# Patient Record
Sex: Female | Born: 1980
Health system: Southern US, Community
[De-identification: ages and names within clinical notes are randomized; demographics above are authoritative.]

## PROBLEM LIST (undated history)

## (undated) DIAGNOSIS — N920 Excessive and frequent menstruation with regular cycle: Secondary | ICD-10-CM

## (undated) DIAGNOSIS — L661 Lichen planopilaris: Secondary | ICD-10-CM

## (undated) DIAGNOSIS — N9489 Other specified conditions associated with female genital organs and menstrual cycle: Secondary | ICD-10-CM

## (undated) DIAGNOSIS — Z8489 Family history of other specified conditions: Secondary | ICD-10-CM

## (undated) DIAGNOSIS — G4733 Obstructive sleep apnea (adult) (pediatric): Principal | ICD-10-CM

## (undated) DIAGNOSIS — L6612 Frontal fibrosing alopecia: Secondary | ICD-10-CM

## (undated) HISTORY — PX: BUNIONECTOMY: SHX129

## (undated) HISTORY — DX: Obstructive sleep apnea (adult) (pediatric): G47.33

---

## 2003-12-10 HISTORY — PX: ORIF ANKLE FRACTURE: SUR919

## 2007-12-10 HISTORY — PX: WISDOM TOOTH EXTRACTION: SHX21

## 2009-12-09 HISTORY — PX: POLYPECTOMY: SHX149

## 2013-07-09 ENCOUNTER — Encounter: Payer: Self-pay | Admitting: Family Medicine

## 2013-07-09 ENCOUNTER — Ambulatory Visit (INDEPENDENT_AMBULATORY_CARE_PROVIDER_SITE_OTHER): Payer: BC Managed Care – PPO | Admitting: Family Medicine

## 2013-07-09 VITALS — BP 109/72 | HR 73 | Ht 64.0 in | Wt 161.0 lb

## 2013-07-09 DIAGNOSIS — R5381 Other malaise: Secondary | ICD-10-CM

## 2013-07-09 DIAGNOSIS — Z3169 Encounter for other general counseling and advice on procreation: Secondary | ICD-10-CM

## 2013-07-09 DIAGNOSIS — E663 Overweight: Secondary | ICD-10-CM

## 2013-07-09 DIAGNOSIS — L309 Dermatitis, unspecified: Secondary | ICD-10-CM

## 2013-07-09 DIAGNOSIS — R5383 Other fatigue: Secondary | ICD-10-CM

## 2013-07-09 DIAGNOSIS — L259 Unspecified contact dermatitis, unspecified cause: Secondary | ICD-10-CM

## 2013-07-09 DIAGNOSIS — Z13220 Encounter for screening for lipoid disorders: Secondary | ICD-10-CM

## 2013-07-09 DIAGNOSIS — E559 Vitamin D deficiency, unspecified: Secondary | ICD-10-CM

## 2013-07-09 DIAGNOSIS — D649 Anemia, unspecified: Secondary | ICD-10-CM

## 2013-07-09 LAB — COMPLETE METABOLIC PANEL WITH GFR
ALT: 14 U/L (ref 0–35)
AST: 21 U/L (ref 0–37)
Albumin: 4.2 g/dL (ref 3.5–5.2)
Alkaline Phosphatase: 63 U/L (ref 39–117)
BUN: 11 mg/dL (ref 6–23)
CO2: 28 mEq/L (ref 19–32)
Calcium: 9.8 mg/dL (ref 8.4–10.5)
Chloride: 102 mEq/L (ref 96–112)
Creat: 1.04 mg/dL (ref 0.50–1.10)
GFR, Est African American: 83 mL/min
GFR, Est Non African American: 72 mL/min
Glucose, Bld: 85 mg/dL (ref 70–99)
Potassium: 4.2 mEq/L (ref 3.5–5.3)
Sodium: 136 mEq/L (ref 135–145)
Total Bilirubin: 0.3 mg/dL (ref 0.3–1.2)
Total Protein: 7.4 g/dL (ref 6.0–8.3)

## 2013-07-09 LAB — CBC WITH DIFFERENTIAL/PLATELET
Basophils Absolute: 0 10*3/uL (ref 0.0–0.1)
Basophils Relative: 1 % (ref 0–1)
Eosinophils Absolute: 0.1 10*3/uL (ref 0.0–0.7)
Eosinophils Relative: 2 % (ref 0–5)
HCT: 36.2 % (ref 36.0–46.0)
Hemoglobin: 12.1 g/dL (ref 12.0–15.0)
Lymphocytes Relative: 36 % (ref 12–46)
Lymphs Abs: 1.9 10*3/uL (ref 0.7–4.0)
MCH: 28.6 pg (ref 26.0–34.0)
MCHC: 33.4 g/dL (ref 30.0–36.0)
MCV: 85.6 fL (ref 78.0–100.0)
Monocytes Absolute: 0.6 10*3/uL (ref 0.1–1.0)
Monocytes Relative: 11 % (ref 3–12)
Neutro Abs: 2.8 10*3/uL (ref 1.7–7.7)
Neutrophils Relative %: 50 % (ref 43–77)
Platelets: 334 10*3/uL (ref 150–400)
RBC: 4.23 MIL/uL (ref 3.87–5.11)
RDW: 14.8 % (ref 11.5–15.5)
WBC: 5.4 10*3/uL (ref 4.0–10.5)

## 2013-07-09 LAB — CHOLESTEROL, TOTAL: Cholesterol: 152 mg/dL (ref 0–200)

## 2013-07-09 MED ORDER — CLOBETASOL PROPIONATE 0.05 % EX CREA: TOPICAL_CREAM | Freq: Two times a day (BID) | CUTANEOUS | Status: DC

## 2013-07-09 MED ORDER — FOLIC ACID 1 MG PO TABS: 1.0000 mg | ORAL_TABLET | Freq: Every day | ORAL | Status: DC

## 2013-07-09 NOTE — Progress Notes (Signed)
  Subjective:    Patient ID: Stephanie Marshall, female    DOB: [birth year], 32 y.o.   MRN: [id number]  HPI  Stephanie Marshall is here today to establish care with our practice.  She was referred to [place] by her husband  Stephanie Marshall).  They recently moved from [place] so she needs to become established with a PCP.  She has a couple of issues to discuss today.      1)  Weight:  She is concerned about her weight.  She has been watching her diet and exercises but continues to gain which is very frustrating for her.   2)  Eczema:  She has struggled with this problem in the past.  She has been given steroid creams which have helped her but she has ran out and does not remember the name.    3)  Heavy Periods:  She has very heavy periods and has been anemic in the past.    4)  Fatigue:  She has been fatigued lately which may be due to her being anemic.    Review of Systems  Constitutional: Positive for fatigue and unexpected weight change.  HENT: Negative.   Eyes: Negative.   Respiratory: Negative.   Cardiovascular: Negative.   Gastrointestinal: Negative.   Endocrine: Negative.   Genitourinary: Negative.   Skin: Positive for rash.  Neurological: Negative.   Psychiatric/Behavioral: Negative.      Past Medical History  Diagnosis Date  . Eczema   . Vitamin D deficiency   . Anemia   . Dysmenorrhea   . Allergy     Family History  Problem Relation Age of Onset  . Hypertension Mother   . Cancer Mother   . Hypertension Father   . Hyperlipidemia Father   . Alcohol abuse Maternal Grandfather   . 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 No distress.  HENT:  Head: Normocephalic.  Eyes: No scleral icterus.  Neck: No thyromegaly present.  Cardiovascular: Normal rate, regular rhythm and normal heart sounds.   Pulmonary/Chest: Effort normal and breath sounds normal.  Abdominal: There is no tenderness.  Musculoskeletal: She exhibits no edema and no tenderness.  Neurological: She is alert.  Skin: Skin is warm and dry.  Psychiatric: She has a normal mood and affect. Her behavior is normal. Judgment and thought content normal.          Assessment & Plan:

## 2013-07-09 NOTE — Patient Instructions (Addendum)
1)  Eczema - Try the Steroid Cream; Clorox Bath 1/4 cup in tub, Tar Shampoo (15 mins)  2)  Preconception - Folic Acid   3)  Anemia/Heavy Bleeding - ? Iron therapy and Lysteda (decreases bleeding)    Exercise to Lose Weight Exercise and a healthy diet may help you lose weight. Your doctor may suggest specific exercises. EXERCISE IDEAS AND TIPS  Choose low-cost things you enjoy doing, such as walking, bicycling, or exercising to workout videos.  Take stairs instead of the elevator.  Walk during your lunch break.  Park your car further away from work or school.  Go to a gym or an exercise class.  Start with 5 to 10 minutes of exercise each day. Build up to 30 minutes of exercise 4 to 6 days a week.  Wear shoes with good support and comfortable clothes.  Stretch before and after working out.  Work out until you breathe harder and your heart beats faster.  Drink extra water when you exercise.  Do not do so much that you hurt yourself, feel dizzy, or get very short of breath. Exercises that burn about 150 calories:  Running 1  miles in 15 minutes.  Playing volleyball for 45 to 60 minutes.  Washing and waxing a car for 45 to 60 minutes.  Playing touch football for 45 minutes.  Walking 1  miles in 35 minutes.  Pushing a stroller 1  miles in 30 minutes.  Playing basketball for 30 minutes.  Raking leaves for 30 minutes.  Bicycling 5 miles in 30 minutes.  Walking 2 miles in 30 minutes.  Dancing for 30 minutes.  Shoveling snow for 15 minutes.  Swimming laps for 20 minutes.  Walking up stairs for 15 minutes.  Bicycling 4 miles in 15 minutes.  Gardening for 30 to 45 minutes.  Jumping rope for 15 minutes.  Washing windows or floors for 45 to 60 minutes. Document Released: 12/28/2010 Document Revised: 02/17/2012 Document Reviewed: 12/28/2010 [facility] Patient Information 2014 [place], [place].

## 2013-07-10 LAB — VITAMIN D 25 HYDROXY (VIT D DEFICIENCY, FRACTURES): Vit D, 25-Hydroxy: 29 ng/mL — ABNORMAL LOW (ref 30–89)

## 2013-07-10 LAB — TSH: TSH: 1.074 u[IU]/mL (ref 0.350–4.500)

## 2013-07-20 ENCOUNTER — Encounter: Payer: Self-pay | Admitting: Family Medicine

## 2013-07-20 ENCOUNTER — Ambulatory Visit (INDEPENDENT_AMBULATORY_CARE_PROVIDER_SITE_OTHER): Payer: BC Managed Care – PPO | Admitting: Family Medicine

## 2013-07-20 VITALS — BP 106/73 | HR 79 | Resp 16 | Wt 158.0 lb

## 2013-07-20 DIAGNOSIS — L309 Dermatitis, unspecified: Secondary | ICD-10-CM | POA: Insufficient documentation

## 2013-07-20 DIAGNOSIS — K649 Unspecified hemorrhoids: Secondary | ICD-10-CM

## 2013-07-20 DIAGNOSIS — B372 Candidiasis of skin and nail: Secondary | ICD-10-CM

## 2013-07-20 DIAGNOSIS — L814 Other melanin hyperpigmentation: Secondary | ICD-10-CM

## 2013-07-20 DIAGNOSIS — R5383 Other fatigue: Secondary | ICD-10-CM | POA: Insufficient documentation

## 2013-07-20 DIAGNOSIS — Z3169 Encounter for other general counseling and advice on procreation: Secondary | ICD-10-CM | POA: Insufficient documentation

## 2013-07-20 DIAGNOSIS — L259 Unspecified contact dermatitis, unspecified cause: Secondary | ICD-10-CM

## 2013-07-20 DIAGNOSIS — L819 Disorder of pigmentation, unspecified: Secondary | ICD-10-CM

## 2013-07-20 DIAGNOSIS — E559 Vitamin D deficiency, unspecified: Secondary | ICD-10-CM | POA: Insufficient documentation

## 2013-07-20 DIAGNOSIS — D649 Anemia, unspecified: Secondary | ICD-10-CM | POA: Insufficient documentation

## 2013-07-20 DIAGNOSIS — E663 Overweight: Secondary | ICD-10-CM | POA: Insufficient documentation

## 2013-07-20 DIAGNOSIS — IMO0001 Reserved for inherently not codable concepts without codable children: Secondary | ICD-10-CM

## 2013-07-20 DIAGNOSIS — Z13220 Encounter for screening for lipoid disorders: Secondary | ICD-10-CM | POA: Insufficient documentation

## 2013-07-20 DIAGNOSIS — R61 Generalized hyperhidrosis: Secondary | ICD-10-CM

## 2013-07-20 DIAGNOSIS — R5381 Other malaise: Secondary | ICD-10-CM | POA: Insufficient documentation

## 2013-07-20 MED ORDER — HYDROQUINONE 4 % EX CREA: TOPICAL_CREAM | Freq: Two times a day (BID) | CUTANEOUS | Status: DC

## 2013-07-20 MED ORDER — NYSTATIN 100000 UNIT/GM EX POWD: Freq: Two times a day (BID) | CUTANEOUS | Status: DC

## 2013-07-20 MED ORDER — TRIAMCINOLONE ACETONIDE 0.1 % EX CREA: TOPICAL_CREAM | Freq: Two times a day (BID) | CUTANEOUS | Status: DC

## 2013-07-20 MED ORDER — ALUMINUM CHLORIDE 20 % EX SOLN: CUTANEOUS | Status: DC

## 2013-07-20 NOTE — Assessment & Plan Note (Addendum)
We discussed several things she can do for her eczema and she was given a prescription for clobetasol cream.

## 2013-07-20 NOTE — Assessment & Plan Note (Addendum)
She has very heavy periods which contributes to her anemia.  We discussed various options for this problem.  We'll check a CBC to see where her hemoglobin is at this point.

## 2013-07-20 NOTE — Assessment & Plan Note (Signed)
Checking a TSH.   

## 2013-07-20 NOTE — Assessment & Plan Note (Signed)
Stephanie Marshall and Stephanie Marshall are not "actively" trying to conceive but they are not doing anything to avoid it either so...she should be on at least 1 mg of folic acid to prevent neural tube defects.

## 2013-07-20 NOTE — Assessment & Plan Note (Signed)
Checking a lipid panel.  

## 2013-07-20 NOTE — Assessment & Plan Note (Signed)
We discussed the fact that she has entered into a new decade and it will be harder to keep the weight off.  She will need to lower her calories and increase her exercise.

## 2013-07-20 NOTE — Progress Notes (Signed)
  Subjective:    Patient ID: Stephanie Marshall, female    DOB: 05-28-1981, 32 y.o.   MRN: [id number]  HPI  [name] is here today to go over her most recent lab results and to discuss the conditions listed below:  1)  Hemorrhoids:  She would like to be referred to a GI specialist for this problem.  She wants to have them surgically removed.   2)  Rash:  She has had a rash on her chest between her breasts.  She feels that this rash is aggravated by her sweating.     Review of Systems  Constitutional: Negative.   HENT: Negative.   Eyes: Negative.   Respiratory: Negative.   Cardiovascular: Negative.   Gastrointestinal: Nausea: Hemorrhoids.  Endocrine: Negative.   Genitourinary: Negative.   Musculoskeletal: Negative.   Skin: Positive for color change (Dark patches) and rash.       chest  Allergic/Immunologic: Negative.   Neurological: Negative.   Hematological: Negative.   Psychiatric/Behavioral: Negative.     Past Medical History  Diagnosis Date  . Eczema   . Vitamin D deficiency   . Anemia   . Dysmenorrhea   . Allergy     Family History  Problem Relation Age of Onset  . Hypertension Mother   . Cancer Mother   . Hypertension Father   . Hyperlipidemia Father   . Alcohol abuse Maternal Grandfather   . 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 Constitutional: She is oriented to person, place, and time.  Eyes: Conjunctivae are normal. No scleral icterus.   Neck: Neck supple. No thyromegaly present.  Cardiovascular: Normal rate, regular rhythm and normal heart sounds.   Pulmonary/Chest: Effort normal and breath sounds normal.    Musculoskeletal: She exhibits no edema and no tenderness.  Lymphadenopathy:    She has no cervical adenopathy.  Neurological: She is alert and oriented to person, place, and time.  Skin: Skin is warm and dry.  Psychiatric: She has a normal mood and affect. Her behavior is normal. Judgment and thought content normal.     Assessment & Plan:

## 2013-07-20 NOTE — Patient Instructions (Addendum)
1)  Excessive Sweating:  Apply the Drysol daily for 1 week then you can apply 1-3 times per day to keep as dry as you want.    2)  Yeast Infection:  Apply the Nystatin Powder 1-2 times per day.  3)  Eczema:  Use the clobetasol for bad outbreaks and the triamcinolone mixed with Eucerin cream for minor outbreaks.    4)  Darkening of Skin - You can use the hydroquinone cream if needed.    Cutaneous Candidiasis Cutaneous candidiasis is a condition in which there is an overgrowth of yeast (candida) on the skin. Yeast normally live on the skin, but in small enough numbers not to cause any symptoms. In certain cases, increased growth of the yeast may cause an actual yeast infection. This kind of infection usually occurs in areas of the skin that are constantly warm and moist, such as the armpits or the groin. Yeast is the most common cause of diaper rash in babies and in people who cannot control their bowel movements (incontinence). CAUSES  The fungus that most often causes cutaneous candidiasis is Candida albicans. Conditions that can increase the risk of getting a yeast infection of the skin include:  Obesity.  Pregnancy.  Diabetes.  Taking antibiotic medicine.  Taking birth control pills.  Taking steroid medicines.  Thyroid disease.  An iron or zinc deficiency.  Problems with the immune system. SYMPTOMS   Red, swollen area of the skin.  Bumps on the skin.  Itchiness. DIAGNOSIS  The diagnosis of cutaneous candidiasis is usually based on its appearance. Light scrapings of the skin may also be taken and viewed under a microscope to identify the presence of yeast. TREATMENT  Antifungal creams may be applied to the infected skin. In severe cases, oral medicines may be needed.  HOME CARE INSTRUCTIONS   Keep your skin clean and dry.  Maintain a healthy weight.  If you have diabetes, keep your blood sugar under control. SEEK IMMEDIATE MEDICAL CARE IF:  Your rash continues to  spread despite treatment.  You have a fever, chills, or abdominal pain. Document Released: 08/13/2011 Document Revised: 02/17/2012 Document Reviewed: 08/13/2011 [facility] Patient Information 2014 [place], [place].

## 2013-07-20 NOTE — Assessment & Plan Note (Signed)
Checking a Vitamin D level.

## 2013-08-25 ENCOUNTER — Encounter: Payer: BC Managed Care – PPO | Admitting: Family Medicine

## 2013-09-04 DIAGNOSIS — IMO0001 Reserved for inherently not codable concepts without codable children: Secondary | ICD-10-CM | POA: Insufficient documentation

## 2013-09-04 DIAGNOSIS — K649 Unspecified hemorrhoids: Secondary | ICD-10-CM | POA: Insufficient documentation

## 2013-09-04 DIAGNOSIS — B372 Candidiasis of skin and nail: Secondary | ICD-10-CM | POA: Insufficient documentation

## 2013-09-04 DIAGNOSIS — L814 Other melanin hyperpigmentation: Secondary | ICD-10-CM | POA: Insufficient documentation

## 2013-09-04 NOTE — Assessment & Plan Note (Signed)
She was given a prescription for Nystatin powder.

## 2013-09-04 NOTE — Assessment & Plan Note (Signed)
She was given prescriptions for Kenalog and Clobetasol.

## 2013-09-04 NOTE — Assessment & Plan Note (Signed)
Her Vitamin D is a little low.  She is to increase her intake to 2000 IU per day.

## 2013-09-04 NOTE — Assessment & Plan Note (Addendum)
She was given a prescription for hydroquinone to lighten the dark patches on her skin.

## 2013-09-04 NOTE — Assessment & Plan Note (Signed)
She was given Dr. Jennye Boroughs information to look into the banding procedure.

## 2013-09-04 NOTE — Assessment & Plan Note (Signed)
She was given a prescription for Drysol.

## 2013-09-09 ENCOUNTER — Other Ambulatory Visit (HOSPITAL_COMMUNITY)
Admission: RE | Admit: 2013-09-09 | Discharge: 2013-09-09 | Disposition: A | Discharge status: Home / Self Care | Payer: BC Managed Care – PPO | Source: Ambulatory Visit | Attending: Family Medicine | Admitting: Family Medicine

## 2013-09-09 ENCOUNTER — Encounter: Payer: Self-pay | Admitting: Family Medicine

## 2013-09-09 ENCOUNTER — Ambulatory Visit (INDEPENDENT_AMBULATORY_CARE_PROVIDER_SITE_OTHER): Payer: BC Managed Care – PPO | Admitting: Family Medicine

## 2013-09-09 VITALS — BP 107/70 | HR 81 | Resp 16 | Ht 64.0 in | Wt 160.0 lb

## 2013-09-09 DIAGNOSIS — Z0000 Encounter for general adult medical examination without abnormal findings: Secondary | ICD-10-CM

## 2013-09-09 DIAGNOSIS — Z1151 Encounter for screening for human papillomavirus (HPV): Secondary | ICD-10-CM | POA: Insufficient documentation

## 2013-09-09 DIAGNOSIS — Z01419 Encounter for gynecological examination (general) (routine) without abnormal findings: Secondary | ICD-10-CM | POA: Insufficient documentation

## 2013-09-09 DIAGNOSIS — Z124 Encounter for screening for malignant neoplasm of cervix: Secondary | ICD-10-CM

## 2013-09-09 MED ORDER — HYDROCORTISONE ACE-PRAMOXINE 2.5-1 % RE CREA: TOPICAL_CREAM | Freq: Three times a day (TID) | RECTAL | Status: DC

## 2013-09-09 MED ORDER — METRONIDAZOLE 0.75 % VA GEL: VAGINAL | Status: DC

## 2013-09-09 NOTE — Patient Instructions (Addendum)
Preventive Care for Adults, Female A healthy lifestyle and preventive care can promote health and wellness. Preventive health guidelines for women include the following key practices.  A routine yearly physical is a good way to check with your caregiver about your health and preventive screening. It is a chance to share any concerns and updates on your health, and to receive a thorough exam.  Visit your dentist for a routine exam and preventive care every 6 months. Brush your teeth twice a day and floss once a day. Good oral hygiene prevents tooth decay and gum disease.  The frequency of eye exams is based on your age, health, family medical history, use of contact lenses, and other factors. Follow your caregiver's recommendations for frequency of eye exams.  Eat a healthy diet. Foods like vegetables, fruits, whole grains, low-fat dairy products, and lean protein foods contain the nutrients you need without too many calories. Decrease your intake of foods high in solid fats, added sugars, and salt. Eat the right amount of calories for you.Get information about a proper diet from your caregiver, if necessary.  Regular physical exercise is one of the most important things you can do for your health. Most adults should get at least 150 minutes of moderate-intensity exercise (any activity that increases your heart rate and causes you to sweat) each week. In addition, most adults need muscle-strengthening exercises on 2 or more days a week.  Maintain a healthy weight. The body mass index (BMI) is a screening tool to identify possible weight problems. It provides an estimate of body fat based on height and weight. Your caregiver can help determine your BMI, and can help you achieve or maintain a healthy weight.For adults 20 years and older:  A BMI below 18.5 is considered underweight.  A BMI of 18.5 to 24.9 is normal.  A BMI of 25 to 29.9 is considered overweight.  A BMI of 30 and above is  considered obese.  Maintain normal blood lipids and cholesterol levels by exercising and minimizing your intake of saturated fat. Eat a balanced diet with plenty of fruit and vegetables. Blood tests for lipids and cholesterol should begin at age 20 and be repeated every 5 years. If your lipid or cholesterol levels are high, you are over 50, or you are at high risk for heart disease, you may need your cholesterol levels checked more frequently.Ongoing high lipid and cholesterol levels should be treated with medicines if diet and exercise are not effective.  If you smoke, find out from your caregiver how to quit. If you do not use tobacco, do not start.  If you are pregnant, do not drink alcohol. If you are breastfeeding, be very cautious about drinking alcohol. If you are not pregnant and choose to drink alcohol, do not exceed 1 drink per day. One drink is considered to be 12 ounces (355 mL) of beer, 5 ounces (148 mL) of wine, or 1.5 ounces (44 mL) of liquor.  Avoid use of street drugs. Do not share needles with anyone. Ask for help if you need support or instructions about stopping the use of drugs.  High blood pressure causes heart disease and increases the risk of stroke. Your blood pressure should be checked at least every 1 to 2 years. Ongoing high blood pressure should be treated with medicines if weight loss and exercise are not effective.  If you are 55 to 31 years old, ask your caregiver if you should take aspirin to prevent strokes.  Diabetes   screening involves taking a blood sample to check your fasting blood sugar level. This should be done once every 3 years, after age 45, if you are within normal weight and without risk factors for diabetes. Testing should be considered at a younger age or be carried out more frequently if you are overweight and have at least 1 risk factor for diabetes.  Breast cancer screening is essential preventive care for women. You should practice "breast  self-awareness." This means understanding the normal appearance and feel of your breasts and may include breast self-examination. Any changes detected, no matter how small, should be reported to a caregiver. Women in their 20s and 30s should have a clinical breast exam (CBE) by a caregiver as part of a regular health exam every 1 to 3 years. After age 40, women should have a CBE every year. Starting at age 40, women should consider having a mammography (breast X-ray test) every year. Women who have a family history of breast cancer should talk to their caregiver about genetic screening. Women at a high risk of breast cancer should talk to their caregivers about having magnetic resonance imaging (MRI) and a mammography every year.  The Pap test is a screening test for cervical cancer. A Pap test can show cell changes on the cervix that might become cervical cancer if left untreated. A Pap test is a procedure in which cells are obtained and examined from the lower end of the uterus (cervix).  Women should have a Pap test starting at age 21.  Between ages 21 and 29, Pap tests should be repeated every 2 years.  Beginning at age 30, you should have a Pap test every 3 years as long as the past 3 Pap tests have been normal.  Some women have medical problems that increase the chance of getting cervical cancer. Talk to your caregiver about these problems. It is especially important to talk to your caregiver if a new problem develops soon after your last Pap test. In these cases, your caregiver may recommend more frequent screening and Pap tests.  The above recommendations are the same for women who have or have not gotten the vaccine for human papillomavirus (HPV).  If you had a hysterectomy for a problem that was not cancer or a condition that could lead to cancer, then you no longer need Pap tests. Even if you no longer need a Pap test, a regular exam is a good idea to make sure no other problems are  starting.  If you are between ages 65 and 70, and you have had normal Pap tests going back 10 years, you no longer need Pap tests. Even if you no longer need a Pap test, a regular exam is a good idea to make sure no other problems are starting.  If you have had past treatment for cervical cancer or a condition that could lead to cancer, you need Pap tests and screening for cancer for at least 20 years after your treatment.  If Pap tests have been discontinued, risk factors (such as a new sexual partner) need to be reassessed to determine if screening should be resumed.  The HPV test is an additional test that may be used for cervical cancer screening. The HPV test looks for the virus that can cause the cell changes on the cervix. The cells collected during the Pap test can be tested for HPV. The HPV test could be used to screen women aged 30 years and older, and should   be used in women of any age who have unclear Pap test results. After the age of 30, women should have HPV testing at the same frequency as a Pap test.  Colorectal cancer can be detected and often prevented. Most routine colorectal cancer screening begins at the age of 50 and continues through age 75. However, your caregiver may recommend screening at an earlier age if you have risk factors for colon cancer. On a yearly basis, your caregiver may provide home test kits to check for hidden blood in the stool. Use of a small camera at the end of a tube, to directly examine the colon (sigmoidoscopy or colonoscopy), can detect the earliest forms of colorectal cancer. Talk to your caregiver about this at age 50, when routine screening begins. Direct examination of the colon should be repeated every 5 to 10 years through age 75, unless early forms of pre-cancerous polyps or small growths are found.  Hepatitis C blood testing is recommended for all people born from 1945 through 1965 and any individual with known risks for hepatitis C.  Practice  safe sex. Use condoms and avoid high-risk sexual practices to reduce the spread of sexually transmitted infections (STIs). STIs include gonorrhea, chlamydia, syphilis, trichomonas, herpes, HPV, and human immunodeficiency virus (HIV). Herpes, HIV, and HPV are viral illnesses that have no cure. They can result in disability, cancer, and death. Sexually active women aged 25 and younger should be checked for chlamydia. Older women with new or multiple partners should also be tested for chlamydia. Testing for other STIs is recommended if you are sexually active and at increased risk.  Osteoporosis is a disease in which the bones lose minerals and strength with aging. This can result in serious bone fractures. The risk of osteoporosis can be identified using a bone density scan. Women ages 65 and over and women at risk for fractures or osteoporosis should discuss screening with their caregivers. Ask your caregiver whether you should take a calcium supplement or vitamin D to reduce the rate of osteoporosis.  Menopause can be associated with physical symptoms and risks. Hormone replacement therapy is available to decrease symptoms and risks. You should talk to your caregiver about whether hormone replacement therapy is right for you.  Use sunscreen with sun protection factor (SPF) of 30 or more. Apply sunscreen liberally and repeatedly throughout the day. You should seek shade when your shadow is shorter than you. Protect yourself by wearing long sleeves, pants, a wide-brimmed hat, and sunglasses year round, whenever you are outdoors.  Once a month, do a whole body skin exam, using a mirror to look at the skin on your back. Notify your caregiver of new moles, moles that have irregular borders, moles that are larger than a pencil eraser, or moles that have changed in shape or color.  Stay current with required immunizations.  Influenza. You need a dose every fall (or winter). The composition of the flu vaccine  changes each year, so being vaccinated once is not enough.  Pneumococcal polysaccharide. You need 1 to 2 doses if you smoke cigarettes or if you have certain chronic medical conditions. You need 1 dose at age 65 (or older) if you have never been vaccinated.  Tetanus, diphtheria, pertussis (Tdap, Td). Get 1 dose of Tdap vaccine if you are younger than age 65, are over 65 and have contact with an infant, are a healthcare worker, are pregnant, or simply want to be protected from whooping cough. After that, you need a Td   booster dose every 10 years. Consult your caregiver if you have not had at least 3 tetanus and diphtheria-containing shots sometime in your life or have a deep or dirty wound.  HPV. You need this vaccine if you are a woman age 26 or younger. The vaccine is given in 3 doses over 6 months.  Measles, mumps, rubella (MMR). You need at least 1 dose of MMR if you were born in 1957 or later. You may also need a second dose.  Meningococcal. If you are age 19 to 21 and a first-year college student living in a residence hall, or have one of several medical conditions, you need to get vaccinated against meningococcal disease. You may also need additional booster doses.  Zoster (shingles). If you are age 60 or older, you should get this vaccine.  Varicella (chickenpox). If you have never had chickenpox or you were vaccinated but received only 1 dose, talk to your caregiver to find out if you need this vaccine.  Hepatitis A. You need this vaccine if you have a specific risk factor for hepatitis A virus infection or you simply wish to be protected from this disease. The vaccine is usually given as 2 doses, 6 to 18 months apart.  Hepatitis B. You need this vaccine if you have a specific risk factor for hepatitis B virus infection or you simply wish to be protected from this disease. The vaccine is given in 3 doses, usually over 6 months. Preventive Services / Frequency Ages 19 to 39  Blood  pressure check.** / Every 1 to 2 years.  Lipid and cholesterol check.** / Every 5 years beginning at age 20.  Clinical breast exam.** / Every 3 years for women in their 20s and 30s.  Pap test.** / Every 2 years from ages 21 through 29. Every 3 years starting at age 30 through age 65 or 70 with a history of 3 consecutive normal Pap tests.  HPV screening.** / Every 3 years from ages 30 through ages 65 to 70 with a history of 3 consecutive normal Pap tests.  Hepatitis C blood test.** / For any individual with known risks for hepatitis C.  Skin self-exam. / Monthly.  Influenza immunization.** / Every year.  Pneumococcal polysaccharide immunization.** / 1 to 2 doses if you smoke cigarettes or if you have certain chronic medical conditions.  Tetanus, diphtheria, pertussis (Tdap, Td) immunization. / A one-time dose of Tdap vaccine. After that, you need a Td booster dose every 10 years.  HPV immunization. / 3 doses over 6 months, if you are 26 and younger.  Measles, mumps, rubella (MMR) immunization. / You need at least 1 dose of MMR if you were born in 1957 or later. You may also need a second dose.  Meningococcal immunization. / 1 dose if you are age 19 to 21 and a first-year college student living in a residence hall, or have one of several medical conditions, you need to get vaccinated against meningococcal disease. You may also need additional booster doses.  Varicella immunization.** / Consult your caregiver.  Hepatitis A immunization.** / Consult your caregiver. 2 doses, 6 to 18 months apart.  Hepatitis B immunization.** / Consult your caregiver. 3 doses usually over 6 months. Ages 40 to 64  Blood pressure check.** / Every 1 to 2 years.  Lipid and cholesterol check.** / Every 5 years beginning at age 20.  Clinical breast exam.** / Every year after age 40.  Mammogram.** / Every year beginning at age 40   and continuing for as long as you are in good health. Consult with your  caregiver.  Pap test.** / Every 3 years starting at age 30 through age 65 or 70 with a history of 3 consecutive normal Pap tests.  HPV screening.** / Every 3 years from ages 30 through ages 65 to 70 with a history of 3 consecutive normal Pap tests.  Fecal occult blood test (FOBT) of stool. / Every year beginning at age 50 and continuing until age 75. You may not need to do this test if you get a colonoscopy every 10 years.  Flexible sigmoidoscopy or colonoscopy.** / Every 5 years for a flexible sigmoidoscopy or every 10 years for a colonoscopy beginning at age 50 and continuing until age 75.  Hepatitis C blood test.** / For all people born from 1945 through 1965 and any individual with known risks for hepatitis C.  Skin self-exam. / Monthly.  Influenza immunization.** / Every year.  Pneumococcal polysaccharide immunization.** / 1 to 2 doses if you smoke cigarettes or if you have certain chronic medical conditions.  Tetanus, diphtheria, pertussis (Tdap, Td) immunization.** / A one-time dose of Tdap vaccine. After that, you need a Td booster dose every 10 years.  Measles, mumps, rubella (MMR) immunization. / You need at least 1 dose of MMR if you were born in 1957 or later. You may also need a second dose.  Varicella immunization.** / Consult your caregiver.  Meningococcal immunization.** / Consult your caregiver.  Hepatitis A immunization.** / Consult your caregiver. 2 doses, 6 to 18 months apart.  Hepatitis B immunization.** / Consult your caregiver. 3 doses, usually over 6 months. Ages 65 and over  Blood pressure check.** / Every 1 to 2 years.  Lipid and cholesterol check.** / Every 5 years beginning at age 20.  Clinical breast exam.** / Every year after age 40.  Mammogram.** / Every year beginning at age 40 and continuing for as long as you are in good health. Consult with your caregiver.  Pap test.** / Every 3 years starting at age 30 through age 65 or 70 with a 3  consecutive normal Pap tests. Testing can be stopped between 65 and 70 with 3 consecutive normal Pap tests and no abnormal Pap or HPV tests in the past 10 years.  HPV screening.** / Every 3 years from ages 30 through ages 65 or 70 with a history of 3 consecutive normal Pap tests. Testing can be stopped between 65 and 70 with 3 consecutive normal Pap tests and no abnormal Pap or HPV tests in the past 10 years.  Fecal occult blood test (FOBT) of stool. / Every year beginning at age 50 and continuing until age 75. You may not need to do this test if you get a colonoscopy every 10 years.  Flexible sigmoidoscopy or colonoscopy.** / Every 5 years for a flexible sigmoidoscopy or every 10 years for a colonoscopy beginning at age 50 and continuing until age 75.  Hepatitis C blood test.** / For all people born from 1945 through 1965 and any individual with known risks for hepatitis C.  Osteoporosis screening.** / A one-time screening for women ages 65 and over and women at risk for fractures or osteoporosis.  Skin self-exam. / Monthly.  Influenza immunization.** / Every year.  Pneumococcal polysaccharide immunization.** / 1 dose at age 65 (or older) if you have never been vaccinated.  Tetanus, diphtheria, pertussis (Tdap, Td) immunization. / A one-time dose of Tdap vaccine if you are over   65 and have contact with an infant, are a Research scientist (physical sciences), or simply want to be protected from whooping cough. After that, you need a Td booster dose every 10 years.  Varicella immunization.** / Consult your caregiver.  Meningococcal immunization.** / Consult your caregiver.  Hepatitis A immunization.** / Consult your caregiver. 2 doses, 6 to 18 months apart.  Hepatitis B immunization.** / Check with your caregiver. 3 doses, usually over 6 months. ** Family history and personal history of risk and conditions may change your caregiver's recommendations. Document Released: 01/21/2002 Document Revised: 02/17/2012  Document Reviewed: 04/22/2011 [facility] Patient Information 2014 [place], [place]. [phone]

## 2013-09-09 NOTE — Progress Notes (Signed)
Subjective:    Patient ID: Stephanie Marshall, female    DOB: 11-Feb-1981, 32 y.o.   MRN: [id number]  HPI  Stephanie Marshall is here today for her annual CPE with PAP.  She has done well since her last office visit.                                                                                     Review of Systems  Constitutional: Negative.   HENT: Negative.   Eyes: Negative.   Respiratory: Negative.   Cardiovascular: Negative.   Gastrointestinal: Negative.   Endocrine: Negative.   Genitourinary: Negative.   Musculoskeletal: Negative.   Skin: Negative.   Allergic/Immunologic: Negative.   Neurological: Negative.   Hematological: Negative.   Psychiatric/Behavioral: Negative.     Past Medical History  Diagnosis Date  . Eczema   . Vitamin D deficiency   . Anemia   . Dysmenorrhea   . Allergy     Family History  Problem Relation Age of Onset  . Hypertension Mother   . Cancer Mother   . Hypertension Father   . Hyperlipidemia Father   . Alcohol abuse Maternal Grandfather   . 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 She appears well-developed and well-nourished.  HENT:  Head: Normocephalic and atraumatic.  Right Ear: External ear normal.  Left Ear: External ear normal.  Nose: Nose normal.  Mouth/Throat: Oropharynx is clear and moist.  Eyes: Conjunctivae and EOM are normal. Pupils are equal, round, and reactive to light.   Neck: Normal range of motion. No thyromegaly present.  Cardiovascular: Normal rate, regular rhythm, normal heart sounds and intact distal pulses.  Exam reveals no gallop and no friction rub.   No murmur heard. Pulmonary/Chest: Effort normal and breath sounds normal. Right breast exhibits no inverted nipple, no mass, no nipple discharge, no skin change and no tenderness. Left breast exhibits no inverted nipple, no mass, no nipple discharge, no skin change and no tenderness. Breasts are symmetrical.  Abdominal: Soft. Bowel sounds are normal. Hernia confirmed negative in the right inguinal area and confirmed negative in the left inguinal area.  Genitourinary: Vagina normal and uterus normal. Pelvic exam was performed with patient supine. There is no rash, tenderness or lesion on the right labia. There is no rash, tenderness or lesion on the left labia. No vaginal discharge found.  Musculoskeletal: Normal range of motion. She exhibits no edema and  no tenderness.  Lymphadenopathy:    She has no cervical adenopathy.       Right: No inguinal adenopathy present.       Left: No inguinal adenopathy present.  Neurological: She is alert and oriented to person, place, and time. She has normal reflexes.  Skin: Skin is warm and dry.  Psychiatric: She has a normal mood and affect. Her behavior is normal. Judgment and thought content normal.       Assessment & Plan:

## 2013-09-11 DIAGNOSIS — Z124 Encounter for screening for malignant neoplasm of cervix: Secondary | ICD-10-CM | POA: Insufficient documentation

## 2013-09-11 DIAGNOSIS — Z0000 Encounter for general adult medical examination without abnormal findings: Secondary | ICD-10-CM | POA: Insufficient documentation

## 2013-09-11 NOTE — Assessment & Plan Note (Signed)
We are checking her for HPV and then 16/18 if positive.

## 2013-09-11 NOTE — Assessment & Plan Note (Signed)
Normal exam; we discussed preventative issues for her age.  She declined the flu shot at this time.

## 2014-02-17 ENCOUNTER — Encounter: Payer: Self-pay | Admitting: Obstetrics & Gynecology

## 2014-02-17 ENCOUNTER — Ambulatory Visit (INDEPENDENT_AMBULATORY_CARE_PROVIDER_SITE_OTHER): Payer: BC Managed Care – PPO | Admitting: Obstetrics & Gynecology

## 2014-02-17 VITALS — BP 123/82 | Temp 97.6°F | Wt 154.0 lb

## 2014-02-17 DIAGNOSIS — Z3201 Encounter for pregnancy test, result positive: Secondary | ICD-10-CM

## 2014-02-17 DIAGNOSIS — Z3400 Encounter for supervision of normal first pregnancy, unspecified trimester: Secondary | ICD-10-CM

## 2014-02-17 DIAGNOSIS — Z3490 Encounter for supervision of normal pregnancy, unspecified, unspecified trimester: Secondary | ICD-10-CM

## 2014-02-17 LAB — POCT URINALYSIS DIPSTICK
BILIRUBIN UA: NEGATIVE
Blood, UA: NEGATIVE
GLUCOSE UA: NEGATIVE
Ketones, UA: NEGATIVE
LEUKOCYTES UA: NEGATIVE
NITRITE UA: NEGATIVE
PH UA: 7
Spec Grav, UA: 1.015
Urobilinogen, UA: NEGATIVE

## 2014-02-17 LAB — OB RESULTS CONSOLE GC/CHLAMYDIA
Chlamydia: NEGATIVE
Gonorrhea: NEGATIVE

## 2014-02-17 NOTE — Patient Instructions (Signed)
Second Trimester of Pregnancy The second trimester is from week 13 through week 28, months 4 through 6. The second trimester is often a time when you feel your best. Your body has also adjusted to being pregnant, and you begin to feel better physically. Usually, morning sickness has lessened or quit completely, you may have more energy, and you may have an increase in appetite. The second trimester is also a time when the fetus is growing rapidly. At the end of the sixth month, the fetus is about 9 inches long and weighs about 1 pounds. You will likely begin to feel the baby move (quickening) between 18 and 20 weeks of the pregnancy. BODY CHANGES Your body goes through many changes during pregnancy. The changes vary from woman to woman.   Your weight will continue to increase. You will notice your lower abdomen bulging out.  You may begin to get stretch marks on your hips, abdomen, and breasts.  You may develop headaches that can be relieved by medicines approved by your caregiver.  You may urinate more often because the fetus is pressing on your bladder.  You may develop or continue to have heartburn as a result of your pregnancy.  You may develop constipation because certain hormones are causing the muscles that push waste through your intestines to slow down.  You may develop hemorrhoids or swollen, bulging veins (varicose veins).  You may have back pain because of the weight gain and pregnancy hormones relaxing your joints between the bones in your pelvis and as a result of a shift in weight and the muscles that support your balance.  Your breasts will continue to grow and be tender.  Your gums may bleed and may be sensitive to brushing and flossing.  Dark spots or blotches (chloasma, mask of pregnancy) may develop on your face. This will likely fade after the baby is born.  A dark line from your belly button to the pubic area (linea nigra) may appear. This will likely fade after  the baby is born. WHAT TO EXPECT AT YOUR PRENATAL VISITS During a routine prenatal visit:  You will be weighed to make sure you and the fetus are growing normally.  Your blood pressure will be taken.  Your abdomen will be measured to track your baby's growth.  The fetal heartbeat will be listened to.  Any test results from the previous visit will be discussed. Your caregiver may ask you:  How you are feeling.  If you are feeling the baby move.  If you have had any abnormal symptoms, such as leaking fluid, bleeding, severe headaches, or abdominal cramping.  If you have any questions. Other tests that may be performed during your second trimester include:  Blood tests that check for:  Low iron levels (anemia).  Gestational diabetes (between 24 and 28 weeks).  Rh antibodies.  Urine tests to check for infections, diabetes, or protein in the urine.  An ultrasound to confirm the proper growth and development of the baby.  An amniocentesis to check for possible genetic problems.  Fetal screens for spina bifida and Down syndrome. HOME CARE INSTRUCTIONS   Avoid all smoking, herbs, alcohol, and unprescribed drugs. These chemicals affect the formation and growth of the baby.  Follow your caregiver's instructions regarding medicine use. There are medicines that are either safe or unsafe to take during pregnancy.  Exercise only as directed by your caregiver. Experiencing uterine cramps is a good sign to stop exercising.  Continue to eat regular,  healthy meals.  Wear a good support bra for breast tenderness.  Do not use hot tubs, steam rooms, or saunas.  Wear your seat belt at all times when driving.  Avoid raw meat, uncooked cheese, cat litter boxes, and soil used by cats. These carry germs that can cause birth defects in the baby.  Take your prenatal vitamins.  Try taking a stool softener (if your caregiver approves) if you develop constipation. Eat more high-fiber  foods, such as fresh vegetables or fruit and whole grains. Drink plenty of fluids to keep your urine clear or pale yellow.  Take warm sitz baths to soothe any pain or discomfort caused by hemorrhoids. Use hemorrhoid cream if your caregiver approves.  If you develop varicose veins, wear support hose. Elevate your feet for 15 minutes, 3 4 times a day. Limit salt in your diet.  Avoid heavy lifting, wear low heel shoes, and practice good posture.  Rest with your legs elevated if you have leg cramps or low back pain.  Visit your dentist if you have not gone yet during your pregnancy. Use a soft toothbrush to brush your teeth and be gentle when you floss.  A sexual relationship may be continued unless your caregiver directs you otherwise.  Continue to go to all your prenatal visits as directed by your caregiver. SEEK MEDICAL CARE IF:   You have dizziness.  You have mild pelvic cramps, pelvic pressure, or nagging pain in the abdominal area.  You have persistent nausea, vomiting, or diarrhea.  You have a bad smelling vaginal discharge.  You have pain with urination. SEEK IMMEDIATE MEDICAL CARE IF:   You have a fever.  You are leaking fluid from your vagina.  You have spotting or bleeding from your vagina.  You have severe abdominal cramping or pain.  You have rapid weight gain or loss.  You have shortness of breath with chest pain.  You notice sudden or extreme swelling of your face, hands, ankles, feet, or legs.  You have not felt your baby move in over an hour.  You have severe headaches that do not go away with medicine.  You have vision changes. Document Released: 11/19/2001 Document Revised: 07/28/2013 Document Reviewed: 01/26/2013 [facility] Patient Information 2014 [place].  Breastfeeding Deciding to breastfeed is one of the best choices you can make for you and your baby. A change in hormones during pregnancy causes your breast tissue to grow and increases the  number and size of your milk ducts. These hormones also allow proteins, sugars, and fats from your blood supply to make breast milk in your milk-producing glands. Hormones prevent breast milk from being released before your baby is born as well as prompt milk flow after birth. Once breastfeeding has begun, thoughts of your baby, as well as his or her sucking or crying, can stimulate the release of milk from your milk-producing glands.  BENEFITS OF BREASTFEEDING For Your Baby  Your first milk (colostrum) helps your baby's digestive system function better.   There are antibodies in your milk that help your baby fight off infections.   Your baby has a lower incidence of asthma, allergies, and sudden infant death syndrome.   The nutrients in breast milk are better for your baby than infant formulas and are designed uniquely for your baby's needs.   Breast milk improves your baby's brain development.   Your baby is less likely to develop other conditions, such as childhood obesity, asthma, or type 2 diabetes mellitus.  For  You   Breastfeeding helps to create a very special bond between you and your baby.   Breastfeeding is convenient. Breast milk is always available at the correct temperature and costs nothing.   Breastfeeding helps to burn calories and helps you lose the weight gained during pregnancy.   Breastfeeding makes your uterus contract to its prepregnancy size faster and slows bleeding (lochia) after you give birth.   Breastfeeding helps to lower your risk of developing type 2 diabetes mellitus, osteoporosis, and breast or ovarian cancer later in life. SIGNS THAT YOUR BABY IS HUNGRY Early Signs of Hunger  Increased alertness or activity.  Stretching.  Movement of the head from side to side.  Movement of the head and opening of the mouth when the corner of the mouth or cheek is stroked (rooting).  Increased sucking sounds, smacking lips, cooing, sighing, or  squeaking.  Hand-to-mouth movements.  Increased sucking of fingers or hands. Late Signs of Hunger  Fussing.  Intermittent crying. Extreme Signs of Hunger Signs of extreme hunger will require calming and consoling before your baby will be able to breastfeed successfully. Do not wait for the following signs of extreme hunger to occur before you initiate breastfeeding:   Restlessness.  A loud, strong cry.   Screaming. BREASTFEEDING BASICS Breastfeeding Initiation  Find a comfortable place to sit or lie down, with your neck and back well supported.  Place a pillow or rolled up blanket under your baby to bring him or her to the level of your breast (if you are seated). Nursing pillows are specially designed to help support your arms and your baby while you breastfeed.  Make sure that your baby's abdomen is facing your abdomen.   Gently massage your breast. With your fingertips, massage from your chest wall toward your nipple in a circular motion. This encourages milk flow. You may need to continue this action during the feeding if your milk flows slowly.  Support your breast with 4 fingers underneath and your thumb above your nipple. Make sure your fingers are well away from your nipple and your baby's mouth.   Stroke your baby's lips gently with your finger or nipple.   When your baby's mouth is open wide enough, quickly bring your baby to your breast, placing your entire nipple and as much of the colored area around your nipple (areola) as possible into your baby's mouth.   More areola should be visible above your baby's upper lip than below the lower lip.   Your baby's tongue should be between his or her lower gum and your breast.   Ensure that your baby's mouth is correctly positioned around your nipple (latched). Your baby's lips should create a seal on your breast and be turned out (everted).  It is common for your baby to suck about 2 3 minutes in order to start the  flow of breast milk. Latching Teaching your baby how to latch on to your breast properly is very important. An improper latch can cause nipple pain and decreased milk supply for you and poor weight gain in your baby. Also, if your baby is not latched onto your nipple properly, he or she may swallow some air during feeding. This can make your baby fussy. Burping your baby when you switch breasts during the feeding can help to get rid of the air. However, teaching your baby to latch on properly is still the best way to prevent fussiness from swallowing air while breastfeeding. Signs that your baby has  successfully latched on to your nipple:    Silent tugging or silent sucking, without causing you pain.   Swallowing heard between every 3 4 sucks.    Muscle movement above and in front of his or her ears while sucking.  Signs that your baby has not successfully latched on to nipple:   Sucking sounds or smacking sounds from your baby while breastfeeding.  Nipple pain. If you think your baby has not latched on correctly, slip your finger into the corner of your baby's mouth to break the suction and place it between your baby's gums. Attempt breastfeeding initiation again. Signs of Successful Breastfeeding Signs from your baby:   A gradual decrease in the number of sucks or complete cessation of sucking.   Falling asleep.   Relaxation of his or her body.   Retention of a small amount of milk in his or her mouth.   Letting go of your breast by himself or herself. Signs from you:  Breasts that have increased in firmness, weight, and size 1 3 hours after feeding.   Breasts that are softer immediately after breastfeeding.  Increased milk volume, as well as a change in milk consistency and color by the 5th day of breastfeeding.   Nipples that are not sore, cracked, or bleeding. Signs That Your [employer]el Books is Getting Enough Milk  Wetting at least 3 diapers in a 24-hour period. The urine  should be clear and pale yellow by age 64 days.  At least 3 stools in a 24-hour period by age 64 days. The stool should be soft and yellow.  At least 3 stools in a 24-hour period by age 616 days. The stool should be seedy and yellow.  No loss of weight greater than 10% of birth weight during the first 91 days of age.  Average weight gain of 4 7 ounces (120 210 mL) per week after age 61 days.  Consistent daily weight gain by age 65 days, without weight loss after the age of 2 weeks. After a feeding, your baby may spit up a small amount. This is common. BREASTFEEDING FREQUENCY AND DURATION Frequent feeding will help you make more milk and can prevent sore nipples and breast engorgement. Breastfeed when you feel the need to reduce the fullness of your breasts or when your baby shows signs of hunger. This is called "breastfeeding on demand." Avoid introducing a pacifier to your baby while you are working to establish breastfeeding (the first 4 6 weeks after your baby is born). After this time you may choose to use a pacifier. Research has shown that pacifier use during the first year of a baby's life decreases the risk of sudden infant death syndrome (SIDS). Allow your baby to feed on each breast as long as he or she wants. Breastfeed until your baby is finished feeding. When your baby unlatches or falls asleep while feeding from the first breast, offer the second breast. Because newborns are often sleepy in the first few weeks of life, you may need to awaken your baby to get him or her to feed. Breastfeeding times will vary from baby to baby. However, the following rules can serve as a guide to help you ensure that your baby is properly fed:  Newborns (babies 53 weeks of age or younger) may breastfeed every 1 3 hours.  Newborns should not go longer than 3 hours during the day or 5 hours during the night without breastfeeding.  You should breastfeed your baby a minimum of  8 times in a 24-hour period until  you begin to introduce solid foods to your baby at around 55 months of age. BREAST MILK PUMPING Pumping and storing breast milk allows you to ensure that your baby is exclusively fed your breast milk, even at times when you are unable to breastfeed. This is especially important if you are going back to work while you are still breastfeeding or when you are not able to be present during feedings. Your lactation consultant can give you guidelines on how long it is safe to store breast milk.  A breast pump is a machine that allows you to pump milk from your breast into a sterile bottle. The pumped breast milk can then be stored in a refrigerator or freezer. Some breast pumps are operated by hand, while others use electricity. Ask your lactation consultant which type will work best for you. Breast pumps can be purchased, but some hospitals and breastfeeding support groups lease breast pumps on a monthly basis. A lactation consultant can teach you how to hand express breast milk, if you prefer not to use a pump.  CARING FOR YOUR BREASTS WHILE YOU BREASTFEED Nipples can become dry, cracked, and sore while breastfeeding. The following recommendations can help keep your breasts moisturized and healthy:  Avoid using soap on your nipples.   Wear a supportive bra. Although not required, special nursing bras and tank tops are designed to allow access to your breasts for breastfeeding without taking off your entire bra or top. Avoid wearing underwire style bras or extremely tight bras.  Air dry your nipples for 3 4minutes after each feeding.   Use only cotton bra pads to absorb leaked breast milk. Leaking of breast milk between feedings is normal.   Use lanolin on your nipples after breastfeeding. Lanolin helps to maintain your skin's normal moisture barrier. If you use pure lanolin you do not need to wash it off before feeding your baby again. Pure lanolin is not toxic to your baby. You may also hand express a  few drops of breast milk and gently massage that milk into your nipples and allow the milk to air dry. In the first few weeks after giving birth, some women experience extremely full breasts (engorgement). Engorgement can make your breasts feel heavy, warm, and tender to the touch. Engorgement peaks within 3 5 days after you give birth. The following recommendations can help ease engorgement:  Completely empty your breasts while breastfeeding or pumping. You may want to start by applying warm, moist heat (in the shower or with warm water-soaked hand towels) just before feeding or pumping. This increases circulation and helps the milk flow. If your baby does not completely empty your breasts while breastfeeding, pump any extra milk after he or she is finished.  Wear a snug bra (nursing or regular) or tank top for 1 2 days to signal your body to slightly decrease milk production.  Apply ice packs to your breasts, unless this is too uncomfortable for you.  Make sure that your baby is latched on and positioned properly while breastfeeding. If engorgement persists after 48 hours of following these recommendations, contact your health care provider or a Science writer. OVERALL HEALTH CARE RECOMMENDATIONS WHILE BREASTFEEDING  Eat healthy foods. Alternate between meals and snacks, eating 3 of each per day. Because what you eat affects your breast milk, some of the foods may make your baby more irritable than usual. Avoid eating these foods if you are sure that they are  negatively affecting your baby.  Drink milk, fruit juice, and water to satisfy your thirst (about 10 glasses a day).   Rest often, relax, and continue to take your prenatal vitamins to prevent fatigue, stress, and anemia.  Continue breast self-awareness checks.  Avoid chewing and smoking tobacco.  Avoid alcohol and drug use. Some medicines that may be harmful to your baby can pass through breast milk. It is important to ask your  health care provider before taking any medicine, including all over-the-counter and prescription medicine as well as vitamin and herbal supplements. It is possible to become pregnant while breastfeeding. If birth control is desired, ask your health care provider about options that will be safe for your baby. SEEK MEDICAL CARE IF:   You feel like you want to stop breastfeeding or have become frustrated with breastfeeding.  You have painful breasts or nipples.  Your nipples are cracked or bleeding.  Your breasts are red, tender, or warm.  You have a swollen area on either breast.  You have a fever or chills.  You have nausea or vomiting.  You have drainage other than breast milk from your nipples.  Your breasts do not become full before feedings by the 5th day after you give birth.  You feel sad and depressed.  Your baby is too sleepy to eat well.  Your baby is having trouble sleeping.   Your baby is wetting less than 3 diapers in a 24-hour period.  Your baby has less than 3 stools in a 24-hour period.  Your baby's skin or the white part of his or her eyes becomes yellow.   Your baby is not gaining weight by 5 days of age. SEEK IMMEDIATE MEDICAL CARE IF:   Your baby is overly tired (lethargic) and does not want to wake up and feed.  Your baby develops an unexplained fever. Document Released: 11/25/2005 Document Revised: 07/28/2013 Document Reviewed: 05/19/2013 [employer] Patient Information 2014 [employer].  

## 2014-02-17 NOTE — Progress Notes (Signed)
Pulse -91 Pt states she has had pain on left side.  Subjective:    Stephanie Marshall is being seen today for her first obstetrical visit.  This is a planned pregnancy. She is at [redacted]w[redacted]d gestation. Her obstetrical history is significant for none. Relationship with FOB: spouse, living together. Patient does intend to breast feed. Pregnancy history fully reviewed.  Menstrual History: OB History   Grav Para Term Preterm Abortions TAB SAB Ect Mult Living   1               Last Pap: 2014 WNL Menarche age: 44  Patient's last menstrual period was 12/04/2013.    The following portions of the patient's history were reviewed and updated as appropriate: allergies, current medications, past family history, past medical history, past social history, past surgical history and problem list.  Review of Systems Pertinent items are noted in HPI.    Objective:      General Appearance:    Alert, cooperative, no distress, appears stated age  Head:    Normocephalic, without obvious abnormality, atraumatic  Eyes:    PERRL, conjunctiva/corneas clear, EOM's intact, fundi    benign, both eyes  Ears:    Normal TM's and external ear canals, both ears  Nose:   Nares normal, septum midline, mucosa normal, no drainage    or sinus tenderness  Throat:   Lips, mucosa, and tongue normal; teeth and gums normal  Neck:   Supple, symmetrical, trachea midline, no adenopathy;    thyroid:  no enlargement/tenderness/nodules; no carotid   bruit or JVD  Back:     Symmetric, no curvature, ROM normal, no CVA tenderness  Lungs:     Clear to auscultation bilaterally, respirations unlabored  Chest Wall:    No tenderness or deformity   Heart:    Regular rate and rhythm, S1 and S2 normal, no murmur, rub   or gallop  Breast Exam:    No tenderness, masses, or nipple abnormality  Abdomen:     Soft, non-tender, bowel sounds active all four quadrants,    no masses, no organomegaly  Genitalia:    Normal female without lesion,  discharge or tenderness  Extremities:   Extremities normal, atraumatic, no cyanosis or edema  Pulses:   2+ and symmetric all extremities  Skin:   Skin color, texture, turgor normal, no rashes or lesions  Lymph nodes:   Cervical, supraclavicular, and axillary nodes normal  Neurologic:   CNII-XII intact, normal strength, sensation and reflexes    throughout        Assessment:    Pregnancy at [redacted]w[redacted]d weeks    Plan:    Initial labs drawn. Prenatal vitamins.  Counseling provided regarding continued use of seat belts, cessation of alcohol consumption, smoking or use of illicit drugs; infection precautions i.e., influenza/TDAP immunizations, toxoplasmosis,CMV, parvovirus, listeria and varicella; workplace safety, exercise during pregnancy; routine dental care, safe medications, sexual activity, hot tubs, saunas, pools, travel, caffeine use, fish and methlymercury, potential toxins, hair treatments, varicose veins Weight gain recommendations per IOM guidelines reviewed: overweight/BMI 25 - 29.9--> gain 15 - 25 lbs FIRST/CF mutation testing discussed. Role of ultrasound in pregnancy discussed Follow up in 6 weeks. 50% of 20 min visit spent on counseling and coordination of care.

## 2014-02-17 NOTE — Addendum Note (Signed)
Addended by: Lewie Loron D on: 02/17/2014 04:15 PM   Modules accepted: Orders

## 2014-02-18 LAB — OBSTETRIC PANEL
Antibody Screen: NEGATIVE
BASOS PCT: 0 % (ref 0–1)
Basophils Absolute: 0 10*3/uL (ref 0.0–0.1)
Eosinophils Absolute: 0.1 10*3/uL (ref 0.0–0.7)
Eosinophils Relative: 1 % (ref 0–5)
HCT: 39.8 % (ref 36.0–46.0)
HEP B S AG: NEGATIVE
Hemoglobin: 13.4 g/dL (ref 12.0–15.0)
Lymphocytes Relative: 16 % (ref 12–46)
Lymphs Abs: 1.7 10*3/uL (ref 0.7–4.0)
MCH: 28.6 pg (ref 26.0–34.0)
MCHC: 33.7 g/dL (ref 30.0–36.0)
MCV: 84.9 fL (ref 78.0–100.0)
MONOS PCT: 8 % (ref 3–12)
Monocytes Absolute: 0.9 10*3/uL (ref 0.1–1.0)
Neutro Abs: 8.2 10*3/uL — ABNORMAL HIGH (ref 1.7–7.7)
Neutrophils Relative %: 75 % (ref 43–77)
Platelets: 287 10*3/uL (ref 150–400)
RBC: 4.69 MIL/uL (ref 3.87–5.11)
RDW: 14.9 % (ref 11.5–15.5)
RH TYPE: POSITIVE
Rubella: 2.56 Index — ABNORMAL HIGH (ref <0.90)
WBC: 10.9 10*3/uL — ABNORMAL HIGH (ref 4.0–10.5)

## 2014-02-18 LAB — GC/CHLAMYDIA PROBE AMP
CT Probe RNA: NEGATIVE
GC Probe RNA: NEGATIVE

## 2014-02-18 LAB — CULTURE, OB URINE: Colony Count: 2000

## 2014-02-18 LAB — VARICELLA ZOSTER ANTIBODY, IGG: VARICELLA IGG: 1398 {index} — AB (ref <135.00)

## 2014-02-18 LAB — VITAMIN D 25 HYDROXY (VIT D DEFICIENCY, FRACTURES): Vit D, 25-Hydroxy: 36 ng/mL (ref 30–89)

## 2014-02-18 LAB — HIV ANTIBODY (ROUTINE TESTING W REFLEX): HIV: NONREACTIVE

## 2014-02-21 LAB — HEMOGLOBINOPATHY EVALUATION
HEMOGLOBIN OTHER: 0 %
HGB F QUANT: 0 % (ref 0.0–2.0)
Hgb A2 Quant: 2.4 % (ref 2.2–3.2)
Hgb A: 97.6 % (ref 96.8–97.8)
Hgb S Quant: 0 %

## 2014-03-07 ENCOUNTER — Encounter: Payer: Self-pay | Admitting: Obstetrics & Gynecology

## 2014-03-16 ENCOUNTER — Other Ambulatory Visit: Payer: Self-pay | Admitting: *Deleted

## 2014-03-16 DIAGNOSIS — O26899 Other specified pregnancy related conditions, unspecified trimester: Secondary | ICD-10-CM

## 2014-03-16 DIAGNOSIS — R12 Heartburn: Principal | ICD-10-CM

## 2014-03-16 MED ORDER — OMEPRAZOLE 20 MG PO CPDR: 20.0000 mg | DELAYED_RELEASE_CAPSULE | Freq: Every day | ORAL | Status: DC

## 2014-03-21 ENCOUNTER — Encounter: Payer: BC Managed Care – PPO | Admitting: Obstetrics & Gynecology

## 2014-03-21 ENCOUNTER — Encounter: Payer: Self-pay | Admitting: Obstetrics & Gynecology

## 2014-03-23 ENCOUNTER — Ambulatory Visit (INDEPENDENT_AMBULATORY_CARE_PROVIDER_SITE_OTHER): Payer: BC Managed Care – PPO | Admitting: Obstetrics & Gynecology

## 2014-03-23 VITALS — BP 115/76 | Temp 97.9°F | Wt 162.0 lb

## 2014-03-23 DIAGNOSIS — Z3400 Encounter for supervision of normal first pregnancy, unspecified trimester: Secondary | ICD-10-CM

## 2014-03-23 LAB — POCT URINALYSIS DIPSTICK
Bilirubin, UA: NEGATIVE
Blood, UA: NEGATIVE
GLUCOSE UA: NEGATIVE
Ketones, UA: NEGATIVE
Leukocytes, UA: NEGATIVE
Nitrite, UA: NEGATIVE
Protein, UA: NEGATIVE
SPEC GRAV UA: 1.015
UROBILINOGEN UA: NEGATIVE
pH, UA: 7

## 2014-03-23 NOTE — Patient Instructions (Signed)
Pregnancy and Travel Most pregnant woman can safely travel until the last month of the pregnancy. If you are planning to travel while pregnant, the best time is between 14 and 28 weeks of the pregnancy. During this period of the pregnancy, morning sickness should be minimal and very few problems should develop. Here are a few things to remember when traveling:  Get a copy of your medical records and take them with you.  Limit or avoid travel if you have medical problems or problems with your pregnancy.  Discuss any trip with your caregiver and get examined shortly before leaving.  Get a good night's sleep before leaving.  Take your pillow with you, if there is room in your luggage.  Try to get names of doctors in the area you will be visiting.  Wear flat, comfortable shoes.  Rest when at your destination.  Eat a balanced diet, take your vitamins and supplements, and drink a lot of fluids.  Do not wear yourself out.  Do not ride on a motorcycle when pregnant. TRAVEL BY CAR  Wear your seat belt properly.  Sit as far away from the dashboard, if you are in the front seat, to avoid getting hit hard if the air bag deploys in an accident.  Stop about every 2 hours to use the restroom and walk around. This helps the circulation in your legs.  Keep water, crackers, and fruit in the car.  Do not travel for more than 6 hours a day.  Take a charged cell phone with you. TRAVEL BY BUS You are more confined in a bus making it hard to walk around every couple of hours.  Get out and walk around if and when the bus stops.  Move your arms and legs when seated. This helps with your circulation.  Take water, crackers, and fruit with you.  Most buses traveling long distances will have a restroom. Ask about that when making your reservation.  After a long trip, lie down for 30 or more minutes with your feet slightly raised.  Take a charged cell phone with you. TRAVEL BY TRAIN  There  usually is more than one restroom.  There is a dining room for meals.  There usually are sleepers for overnight and long trips. Ask about sleepers when making your reservation.  It is a good idea to lie down for 30 minutes with your feet elevated after your trip.  Take a charged cell phone with you. TRAVEL BY PLANE  Pregnant women may be restricted from [place] after a certain time of the pregnancy. Every airline has its own rules and regulations. You should ask about them when making your reservation.  Usually there are only 2 or 3 restrooms available for 150 or more passengers.  You should not fly above 7000 feet in an unpressurized plane.  You cannot get up and walk around freely.  Wear your seatbelt at all times.  Put all your medications and medical records in your carry-on bag.  Take water, crackers, and fruit with you.  Try to get a bulkhead or an aisle seat.  Avoid caffeine drinks and do not eat a big meal before flying.  There are special meals you can order when making your reservation, if the airline is serving meals.  Wear layered clothing because the temperature in the cabin can change.  Move your arms and legs while sitting to help with your circulation.  After a long trip, lie down for 30 or more minutes  with your feet slightly elevated.  Take a charged cell phone with you. TRAVEL BY CRUISE SHIP  You should check with the cruise line and ask several questions, such as:  Are pregnant women allowed on the cruise?  Is there a medical facility and doctor on board?  Does the ship dock in cities where there are doctors and medical facilities?  Rest and lie down with your feet raised for 30 or more minutes after arriving.  Take a charged cell phone with you.  Ask your caregiver if:  Any medications are safe for you to take if you get seasick.  It is safe for you to wear acupressure wristbands to prevent getting seasick. TRAVEL TO A FOREIGN  COUNTRY  Discuss the trip with your caregiver.  Get copies of your medical records and have them with you at all times.  Take your medications and important documents with you in your carry-on bag.  Ask your caregiver if there are any medications that are safe to take for diarrhea, constipation, nausea, or vomiting.  Check to be sure you have the required vaccinations for entry into that country. The Center for Disease Control and Prevention (CDC) can advise you 715-482-5694) about vaccination requirements.  Ask your medical insurance company if you and your baby will have insurance coverage while traveling outside the country. Emergency medical and evacuation insurance information is available at the following website: www.travel.guard.com  Before making plans to go to a foreign country, call the [employer] for the name of the [employer] to get information regarding the embassy's location, weather, any prevalent disease problems, crime, and other questions you might have.  It is a good idea to make copies of your medical records, passport, and other important papers, in case you lose the originals.  Do not eat uncooked foods of any kind.  Drink bottled water and do not use ice.  Wash fruits and vegetables with soapy hot water.  Only drink pasteurized milk.  After you arrive, ask for the location of doctors and hospitals.  After arriving, lie down for 30 minutes or more and get some rest.  Your cell phone may not work in many foreign countries. Ask your mobile phone carrier about cell phone coverage. Document Released: 11/07/2008 Document Revised: 02/17/2012 Document Reviewed: 11/07/2008 [facility] Patient Information 2014 [place]. Exercise During Pregnancy It is possible to maintain a healthy exercise program throughout a pregnancy. However, it is important to discuss exercising with your caregiver, so that the two of you may develop an appropriate  exercise program. It is important to remember that exercise during pregnancy should be use to maintain one's health and not to lose weight. Strenuous activities should be avoided as the may cause the baby to have difficulty obtaining proper amounts of oxygen. A proper pregnancy exercise plan has many benefits including preparing you for the physical challenges of childbirth by strengthening the muscles that help with childbirth, reducing common backaches, alleviating constipation, improving posture, elevating mood, and promoting better sleep. If possible, begin exercising regularly before you become pregnant and continue through the duration of the pregnancy. It is difficult for a woman to begin an exercise program later in a pregnancy due to enlargement of the uterus and breasts and a shift in the center of gravity. Pregnancy exercise programs should be aimed at improving the muscles of the heart, back, pelvis, and abdomen. GENERAL GUIDELINES  Every woman and pregnancy is different; thus, the level of exercise you can do depends  on your health, the conditions of the pregnancy, and activity level before pregnancy. For women who were sedentary before pregnancy, walking is a good way to begin. Use caution while participation in sports during pregnancy, because your center of gravity changes and may affect your balance or that require rapid movements. Always make sure to drink plenty of fluids to avoid dehydration, which may decrease blood flow to your baby. Avoid any activity that has the potential for trauma to the abdomen. If possible try to avoid high-altitude activities, which can deprive you and your baby of oxygen; this may cause premature labor. Talk with your caregiver.  Performing a proper warm up and cool down are very important. It is important to start slowly and build up to more demanding exercises. Toward the end of an exercise session, gradually slow your activity. Perhaps try and work back through  the exercises in reverse order. Check your pulse during peak activity, and discuss with your caregiver an appropriate range of heart rate for activity. Slow down your activity if your heart starts beating faster than the target range recommended by your health care provider. Do not exceed a heart rate of 140 beats per minute. Exercise that is too strenuous may speed up the baby's heartbeat to a dangerous level. In general, if you are able to carry on a conversation comfortably while exercising, your heart rate is probably within the recommended limits. Check to make sure.  You should stop exercising and call your health care provider if you have any unusual symptoms, such as pain, uterine contractions, chest pain, bleeding or fluid leakage from the vagina, dizziness, or shortness of breath. Talk to your health caregiver if you have any questions.  Document Released: 11/25/2005 Document Revised: 02/17/2012 Document Reviewed: 03/09/2009 [facility] Patient Information 2014 [place], [place].

## 2014-03-23 NOTE — Progress Notes (Signed)
Pulse:76 Patient states she has some abdominal pressure when she lays on her stomach. Patient states she is having burning in her stomach, chest, throat. Patient states she had a yellow coated tongue and bright yellow urine. Patient states she only drinks water. Patient states what exercises should she avoid now that she is in her second trimester. Patient states she she has sex she gets a cramp in her pelvic area. Patient would like to know a no fly date for in and out of the country. Patient states there anything she needs to be aware of or goals that she needs to be meeting now that she is in the second trimester. Declines genetic screenings.

## 2014-04-13 ENCOUNTER — Ambulatory Visit (INDEPENDENT_AMBULATORY_CARE_PROVIDER_SITE_OTHER): Payer: BC Managed Care – PPO | Admitting: Obstetrics

## 2014-04-13 ENCOUNTER — Encounter: Payer: Self-pay | Admitting: Obstetrics

## 2014-04-13 ENCOUNTER — Ambulatory Visit (INDEPENDENT_AMBULATORY_CARE_PROVIDER_SITE_OTHER): Payer: BC Managed Care – PPO

## 2014-04-13 VITALS — BP 117/77 | HR 85 | Temp 98.1°F | Wt 165.0 lb

## 2014-04-13 DIAGNOSIS — O359XX0 Maternal care for (suspected) fetal abnormality and damage, unspecified, not applicable or unspecified: Secondary | ICD-10-CM

## 2014-04-13 DIAGNOSIS — Z1389 Encounter for screening for other disorder: Secondary | ICD-10-CM

## 2014-04-13 DIAGNOSIS — Z3400 Encounter for supervision of normal first pregnancy, unspecified trimester: Secondary | ICD-10-CM

## 2014-04-13 NOTE — Progress Notes (Signed)
  Subjective:    Stephanie Marshall is a 33 y.o. female being seen today for her obstetrical visit. She is at [redacted]w[redacted]d gestation. Patient reports: no complaints.  Problem List Items Addressed This Visit   None    Visit Diagnoses   Unspecified fetal abnormality affecting management of mother, antepartum condition or complication    -  Primary    Relevant Orders       AMB Referral to Maternal Fetal Medicine (MFM)      Patient Active Problem List   Diagnosis Date Noted  . Supervision of normal first pregnancy 02/17/2014  . Routine general medical examination at a health care facility 09/11/2013  . Screening for malignant neoplasm of the cervix 09/11/2013  . Hidrosis 09/04/2013  . Candidal intertrigo 09/04/2013  . Hemorrhoids 09/04/2013  . Darkening of skin 09/04/2013  . Anemia 07/20/2013  . Other malaise and fatigue 07/20/2013  . Need for lipid screening 07/20/2013  . Unspecified vitamin D deficiency 07/20/2013  . Eczema 07/20/2013  . Overweight 07/20/2013  . Encounter for preconception consultation 07/20/2013    Objective:     BP 117/77  Pulse 85  Temp(Src) 98.1 F (36.7 C)  Wt 165 lb (74.844 kg)  LMP 12/04/2013 Uterine Size: Below umbilicus     Assessment:    Pregnancy @ [redacted]w[redacted]d  weeks Doing well    Ultrasound done today.  Could not visualize right kidney.   Plan:    Problem list reviewed and updated. Labs reviewed. Patient referred to MFM for F/U ultrasound and consultation.  Follow up in 2 weeks. FIRST/CF mutation testing/NIPT/QUAD SCREEN/fragile X/Ashkenazi Jewish population testing/Spinal muscular atrophy discussed: requested. Role of ultrasound in pregnancy discussed; fetal survey: requested. Amniocentesis discussed: not indicated.

## 2014-04-14 ENCOUNTER — Encounter: Payer: Self-pay | Admitting: Obstetrics & Gynecology

## 2014-04-14 ENCOUNTER — Other Ambulatory Visit: Payer: Self-pay | Admitting: *Deleted

## 2014-04-14 DIAGNOSIS — O365990 Maternal care for other known or suspected poor fetal growth, unspecified trimester, not applicable or unspecified: Secondary | ICD-10-CM

## 2014-04-14 LAB — US OB COMP + 14 WK

## 2014-04-14 LAB — POCT URINALYSIS DIPSTICK
Bilirubin, UA: NEGATIVE
Blood, UA: NEGATIVE
Glucose, UA: NEGATIVE
Ketones, UA: NEGATIVE
Leukocytes, UA: NEGATIVE
Nitrite, UA: NEGATIVE
PH UA: 5
PROTEIN UA: NEGATIVE
SPEC GRAV UA: 1.02
Urobilinogen, UA: NEGATIVE

## 2014-04-14 NOTE — Addendum Note (Signed)
Addended by: Carole Binning on: 04/14/2014 10:22 AM   Modules accepted: Orders

## 2014-04-27 ENCOUNTER — Other Ambulatory Visit: Payer: BC Managed Care – PPO

## 2014-04-27 ENCOUNTER — Ambulatory Visit (HOSPITAL_COMMUNITY)
Admission: RE | Admit: 2014-04-27 | Discharge: 2014-04-27 | Disposition: A | Discharge status: Home / Self Care | Payer: BC Managed Care – PPO | Source: Ambulatory Visit | Attending: Obstetrics & Gynecology | Admitting: Obstetrics & Gynecology

## 2014-04-27 ENCOUNTER — Ambulatory Visit (HOSPITAL_COMMUNITY): Payer: BC Managed Care – PPO

## 2014-04-27 ENCOUNTER — Encounter: Payer: BC Managed Care – PPO | Admitting: Obstetrics & Gynecology

## 2014-04-27 ENCOUNTER — Encounter (HOSPITAL_COMMUNITY): Payer: Self-pay

## 2014-04-27 DIAGNOSIS — Z3689 Encounter for other specified antenatal screening: Secondary | ICD-10-CM | POA: Insufficient documentation

## 2014-04-27 DIAGNOSIS — O365990 Maternal care for other known or suspected poor fetal growth, unspecified trimester, not applicable or unspecified: Secondary | ICD-10-CM

## 2014-04-30 ENCOUNTER — Encounter: Payer: Self-pay | Admitting: Obstetrics & Gynecology

## 2014-04-30 DIAGNOSIS — O358XX0 Maternal care for other (suspected) fetal abnormality and damage, not applicable or unspecified: Secondary | ICD-10-CM | POA: Insufficient documentation

## 2014-04-30 DIAGNOSIS — O35EXX0 Maternal care for other (suspected) fetal abnormality and damage, fetal genitourinary anomalies, not applicable or unspecified: Secondary | ICD-10-CM | POA: Insufficient documentation

## 2014-05-11 ENCOUNTER — Encounter: Payer: Self-pay | Admitting: Obstetrics & Gynecology

## 2014-05-11 ENCOUNTER — Ambulatory Visit (INDEPENDENT_AMBULATORY_CARE_PROVIDER_SITE_OTHER): Payer: BC Managed Care – PPO | Admitting: Obstetrics & Gynecology

## 2014-05-11 ENCOUNTER — Other Ambulatory Visit: Payer: Self-pay | Admitting: Obstetrics & Gynecology

## 2014-05-11 VITALS — BP 109/73 | HR 92 | Temp 98.7°F | Wt 176.0 lb

## 2014-05-11 DIAGNOSIS — IMO0002 Reserved for concepts with insufficient information to code with codable children: Secondary | ICD-10-CM

## 2014-05-11 DIAGNOSIS — Z3400 Encounter for supervision of normal first pregnancy, unspecified trimester: Secondary | ICD-10-CM

## 2014-05-16 LAB — POCT URINALYSIS DIPSTICK
BILIRUBIN UA: NEGATIVE
Glucose, UA: NEGATIVE
Ketones, UA: NEGATIVE
LEUKOCYTES UA: NEGATIVE
Nitrite, UA: NEGATIVE
PH UA: 5
Protein, UA: NEGATIVE
RBC UA: NEGATIVE
Spec Grav, UA: 1.02
Urobilinogen, UA: NEGATIVE

## 2014-06-17 ENCOUNTER — Other Ambulatory Visit: Payer: BC Managed Care – PPO

## 2014-06-17 ENCOUNTER — Encounter: Payer: Self-pay | Admitting: *Deleted

## 2014-06-17 ENCOUNTER — Ambulatory Visit (INDEPENDENT_AMBULATORY_CARE_PROVIDER_SITE_OTHER): Payer: BC Managed Care – PPO | Admitting: Advanced Practice Midwife

## 2014-06-17 ENCOUNTER — Encounter: Payer: Self-pay | Admitting: Advanced Practice Midwife

## 2014-06-17 VITALS — BP 107/71 | HR 87 | Temp 97.8°F | Wt 179.0 lb

## 2014-06-17 DIAGNOSIS — Z3402 Encounter for supervision of normal first pregnancy, second trimester: Secondary | ICD-10-CM

## 2014-06-17 DIAGNOSIS — Z3400 Encounter for supervision of normal first pregnancy, unspecified trimester: Secondary | ICD-10-CM

## 2014-06-17 NOTE — Progress Notes (Signed)
Subjective: Stephanie Marshall is a 33 y.o. at 27 weeks by LMP, 20  Patient denies vaginal leaking of fluid or bleeding, denies contractions.  Reports positive fetal movment.  Denies concerns today. Leaving to [place] this weekend for her "Babymoon".   Patient has eczema between her breast on her chest, ask what she can apply that won't be harmful to the baby.   Objective: Filed Vitals:   06/17/14 0952  BP: 107/71  Pulse: 87  Temp: 97.8 F (36.6 C)   140 FHR 27 Fundal Height Fetal Position uncertain  Assessment: Patient Active Problem List   Diagnosis Date Noted  . Kidney abnormality of fetus on prenatal ultrasound 04/30/2014  . Supervision of normal first pregnancy 02/17/2014  . Routine general medical examination at a health care facility 09/11/2013  . Screening for malignant neoplasm of the cervix 09/11/2013  . Hidrosis 09/04/2013  . Candidal intertrigo 09/04/2013  . Hemorrhoids 09/04/2013  . Darkening of skin 09/04/2013  . Anemia 07/20/2013  . Other malaise and fatigue 07/20/2013  . Need for lipid screening 07/20/2013  . Unspecified vitamin D deficiency 07/20/2013  . Eczema 07/20/2013  . Overweight 07/20/2013  . Encounter for preconception consultation 07/20/2013    Plan: Patient to return to clinic in 2 weeks Gave note to fly Recommended nipple cream postpartum to eczema area as a possible cream that would be safe with the baby.  2 hour today, review [place] Reviewed warning signs in pregnancy. Patient to call with concerns PRN. Reviewed triage location. Repeat US for f/u secondary to absent kidney @ [facility].  Alexande Sheerin Roni Bread CNM

## 2014-06-18 LAB — CBC
HCT: 37.8 % (ref 36.0–46.0)
HEMOGLOBIN: 12.6 g/dL (ref 12.0–15.0)
MCH: 30.5 pg (ref 26.0–34.0)
MCHC: 33.3 g/dL (ref 30.0–36.0)
MCV: 91.5 fL (ref 78.0–100.0)
Platelets: 281 10*3/uL (ref 150–400)
RBC: 4.13 MIL/uL (ref 3.87–5.11)
RDW: 14.2 % (ref 11.5–15.5)
WBC: 10.6 10*3/uL — ABNORMAL HIGH (ref 4.0–10.5)

## 2014-06-18 LAB — HIV ANTIBODY (ROUTINE TESTING W REFLEX): HIV 1&2 Ab, 4th Generation: NONREACTIVE

## 2014-06-18 LAB — RPR

## 2014-06-18 LAB — GLUCOSE TOLERANCE, 2 HOURS W/ 1HR
GLUCOSE, 2 HOUR: 79 mg/dL (ref 70–139)
GLUCOSE: 67 mg/dL — AB (ref 70–170)
Glucose, Fasting: 55 mg/dL — ABNORMAL LOW (ref 70–99)

## 2014-06-21 LAB — POCT URINALYSIS DIPSTICK
Bilirubin, UA: NEGATIVE
GLUCOSE UA: NEGATIVE
Ketones, UA: NEGATIVE
Leukocytes, UA: NEGATIVE
NITRITE UA: NEGATIVE
Protein, UA: NEGATIVE
RBC UA: NEGATIVE
Spec Grav, UA: <=1.005
Urobilinogen, UA: NEGATIVE
pH, UA: 7

## 2014-06-26 ENCOUNTER — Inpatient Hospital Stay (HOSPITAL_COMMUNITY)
Admission: AD | Admit: 2014-06-26 | Payer: BC Managed Care – PPO | Source: Ambulatory Visit | Admitting: Obstetrics & Gynecology

## 2014-06-29 ENCOUNTER — Ambulatory Visit (HOSPITAL_COMMUNITY): Payer: BC Managed Care – PPO

## 2014-07-06 ENCOUNTER — Encounter: Payer: Self-pay | Admitting: Obstetrics & Gynecology

## 2014-07-06 ENCOUNTER — Ambulatory Visit (HOSPITAL_COMMUNITY)
Admission: RE | Admit: 2014-07-06 | Discharge: 2014-07-06 | Disposition: A | Discharge status: Home / Self Care | Payer: BC Managed Care – PPO | Source: Ambulatory Visit | Attending: Advanced Practice Midwife | Admitting: Advanced Practice Midwife

## 2014-07-06 ENCOUNTER — Ambulatory Visit (INDEPENDENT_AMBULATORY_CARE_PROVIDER_SITE_OTHER): Payer: BC Managed Care – PPO | Admitting: Obstetrics & Gynecology

## 2014-07-06 ENCOUNTER — Other Ambulatory Visit: Payer: BC Managed Care – PPO

## 2014-07-06 VITALS — BP 107/70 | HR 83 | Temp 97.8°F | Wt 183.0 lb

## 2014-07-06 DIAGNOSIS — Z3402 Encounter for supervision of normal first pregnancy, second trimester: Secondary | ICD-10-CM

## 2014-07-06 DIAGNOSIS — Z3403 Encounter for supervision of normal first pregnancy, third trimester: Secondary | ICD-10-CM

## 2014-07-06 DIAGNOSIS — Z3689 Encounter for other specified antenatal screening: Secondary | ICD-10-CM | POA: Insufficient documentation

## 2014-07-06 DIAGNOSIS — Z3400 Encounter for supervision of normal first pregnancy, unspecified trimester: Secondary | ICD-10-CM

## 2014-07-06 DIAGNOSIS — O358XX0 Maternal care for other (suspected) fetal abnormality and damage, not applicable or unspecified: Secondary | ICD-10-CM | POA: Insufficient documentation

## 2014-07-06 NOTE — Progress Notes (Signed)
Subjective:    Stephanie Marshall is a 33 y.o. female being seen today for her obstetrical visit. She is at [redacted]w[redacted]d gestation. Patient reports no complaints. Fetal movement: normal.  Problem List Items Addressed This Visit     High   Supervision of normal first pregnancy - Primary   Relevant Orders      POCT urinalysis dipstick     Patient Active Problem List   Diagnosis Date Noted  . Kidney abnormality of fetus on prenatal ultrasound 04/30/2014    Priority: High  . Supervision of normal first pregnancy 02/17/2014    Priority: High  . Routine general medical examination at a health care facility 09/11/2013  . Screening for malignant neoplasm of the cervix 09/11/2013  . Hidrosis 09/04/2013  . Candidal intertrigo 09/04/2013  . Hemorrhoids 09/04/2013  . Darkening of skin 09/04/2013  . Anemia 07/20/2013  . Other malaise and fatigue 07/20/2013  . Need for lipid screening 07/20/2013  . Unspecified vitamin D deficiency 07/20/2013  . Eczema 07/20/2013  . Overweight 07/20/2013  . Encounter for preconception consultation 07/20/2013   Objective:    BP 107/70  Pulse 83  Temp(Src) 97.8 F (36.6 C)  Wt 83.008 kg (183 lb)  LMP 12/04/2013 FHT:  140 BPM  Uterine Size: size equals dates  Presentation: cephalic     Assessment:    Pregnancy @ [redacted]w[redacted]d weeks   Plan:     labs reviewed, problem list updated   Orders Placed This Encounter  Procedures  . POCT urinalysis dipstick    Follow up in 2 Weeks.

## 2014-07-12 NOTE — Patient Instructions (Signed)
Third Trimester of Pregnancy The third trimester is from week 29 through week 42, months 7 through 9. The third trimester is a time when the fetus is growing rapidly. At the end of the ninth month, the fetus is about 20 inches in length and weighs 6-10 pounds.  BODY CHANGES Your body goes through many changes during pregnancy. The changes vary from woman to woman.   Your weight will continue to increase. You can expect to gain 25-35 pounds (11-16 kg) by the end of the pregnancy.  You may begin to get stretch marks on your hips, abdomen, and breasts.  You may urinate more often because the fetus is moving lower into your pelvis and pressing on your bladder.  You may develop or continue to have heartburn as a result of your pregnancy.  You may develop constipation because certain hormones are causing the muscles that push waste through your intestines to slow down.  You may develop hemorrhoids or swollen, bulging veins (varicose veins).  You may have pelvic pain because of the weight gain and pregnancy hormones relaxing your joints between the bones in your pelvis. Backaches may result from overexertion of the muscles supporting your posture.  You may have changes in your hair. These can include thickening of your hair, rapid growth, and changes in texture. Some women also have hair loss during or after pregnancy, or hair that feels dry or thin. Your hair will most likely return to normal after your baby is born.  Your breasts will continue to grow and be tender. A yellow discharge may leak from your breasts called colostrum.  Your belly button may stick out.  You may feel short of breath because of your expanding uterus.  You may notice the fetus "dropping," or moving lower in your abdomen.  You may have a bloody mucus discharge. This usually occurs a few days to a week before labor begins.  Your cervix becomes thin and soft (effaced) near your due date. WHAT TO EXPECT AT YOUR PRENATAL  EXAMS  You will have prenatal exams every 2 weeks until week 36. Then, you will have weekly prenatal exams. During a routine prenatal visit:  You will be weighed to make sure you and the fetus are growing normally.  Your blood pressure is taken.  Your abdomen will be measured to track your baby's growth.  The fetal heartbeat will be listened to.  Any test results from the previous visit will be discussed.  You may have a cervical check near your due date to see if you have effaced. At around 36 weeks, your caregiver will check your cervix. At the same time, your caregiver will also perform a test on the secretions of the vaginal tissue. This test is to determine if a type of bacteria, Group B streptococcus, is present. Your caregiver will explain this further. Your caregiver may ask you:  What your birth plan is.  How you are feeling.  If you are feeling the baby move.  If you have had any abnormal symptoms, such as leaking fluid, bleeding, severe headaches, or abdominal cramping.  If you have any questions. Other tests or screenings that may be performed during your third trimester include:  Blood tests that check for low iron levels (anemia).  Fetal testing to check the health, activity level, and growth of the fetus. Testing is done if you have certain medical conditions or if there are problems during the pregnancy. FALSE LABOR You may feel small, irregular contractions that   eventually go away. These are called Braxton Hicks contractions, or false labor. Contractions may last for hours, days, or even weeks before true labor sets in. If contractions come at regular intervals, intensify, or become painful, it is best to be seen by your caregiver.  SIGNS OF LABOR   Menstrual-like cramps.  Contractions that are 5 minutes apart or less.  Contractions that start on the top of the uterus and spread down to the lower abdomen and back.  A sense of increased pelvic pressure or back  pain.  A watery or bloody mucus discharge that comes from the vagina. If you have any of these signs before the 37th week of pregnancy, call your caregiver right away. You need to go to the hospital to get checked immediately. HOME CARE INSTRUCTIONS   Avoid all smoking, herbs, alcohol, and unprescribed drugs. These chemicals affect the formation and growth of the baby.  Follow your caregiver's instructions regarding medicine use. There are medicines that are either safe or unsafe to take during pregnancy.  Exercise only as directed by your caregiver. Experiencing uterine cramps is a good sign to stop exercising.  Continue to eat regular, healthy meals.  Wear a good support bra for breast tenderness.  Do not use hot tubs, steam rooms, or saunas.  Wear your seat belt at all times when driving.  Avoid raw meat, uncooked cheese, cat litter boxes, and soil used by cats. These carry germs that can cause birth defects in the baby.  Take your prenatal vitamins.  Try taking a stool softener (if your caregiver approves) if you develop constipation. Eat more high-fiber foods, such as fresh vegetables or fruit and whole grains. Drink plenty of fluids to keep your urine clear or pale yellow.  Take warm sitz baths to soothe any pain or discomfort caused by hemorrhoids. Use hemorrhoid cream if your caregiver approves.  If you develop varicose veins, wear support hose. Elevate your feet for 15 minutes, 3-4 times a day. Limit salt in your diet.  Avoid heavy lifting, wear low heal shoes, and practice good posture.  Rest a lot with your legs elevated if you have leg cramps or low back pain.  Visit your dentist if you have not gone during your pregnancy. Use a soft toothbrush to brush your teeth and be gentle when you floss.  A sexual relationship may be continued unless your caregiver directs you otherwise.  Do not travel far distances unless it is absolutely necessary and only with the approval  of your caregiver.  Take prenatal classes to understand, practice, and ask questions about the labor and delivery.  Make a trial run to the hospital.  Pack your hospital bag.  Prepare the baby's nursery.  Continue to go to all your prenatal visits as directed by your caregiver. SEEK MEDICAL CARE IF:  You are unsure if you are in labor or if your water has broken.  You have dizziness.  You have mild pelvic cramps, pelvic pressure, or nagging pain in your abdominal area.  You have persistent nausea, vomiting, or diarrhea.  You have a bad smelling vaginal discharge.  You have pain with urination. SEEK IMMEDIATE MEDICAL CARE IF:   You have a fever.  You are leaking fluid from your vagina.  You have spotting or bleeding from your vagina.  You have severe abdominal cramping or pain.  You have rapid weight loss or gain.  You have shortness of breath with chest pain.  You notice sudden or extreme swelling   of your face, hands, ankles, feet, or legs.  You have not felt your baby move in over an hour.  You have severe headaches that do not go away with medicine.  You have vision changes. Document Released: 11/19/2001 Document Revised: 11/30/2013 Document Reviewed: 01/26/2013 [employer] Patient Information 2015 [employer]. This information is not intended to replace advice given to you by your health care provider. Make sure you discuss any questions you have with your health care provider.  

## 2014-07-27 ENCOUNTER — Ambulatory Visit (INDEPENDENT_AMBULATORY_CARE_PROVIDER_SITE_OTHER): Payer: BC Managed Care – PPO | Admitting: Obstetrics & Gynecology

## 2014-07-27 ENCOUNTER — Telehealth: Payer: Self-pay

## 2014-07-27 VITALS — BP 121/76 | HR 90 | Temp 97.8°F | Wt 186.0 lb

## 2014-07-27 DIAGNOSIS — Z3403 Encounter for supervision of normal first pregnancy, third trimester: Secondary | ICD-10-CM

## 2014-07-27 DIAGNOSIS — O289 Unspecified abnormal findings on antenatal screening of mother: Secondary | ICD-10-CM

## 2014-07-27 DIAGNOSIS — Z3400 Encounter for supervision of normal first pregnancy, unspecified trimester: Secondary | ICD-10-CM

## 2014-07-27 LAB — POCT URINALYSIS DIPSTICK
BILIRUBIN UA: NEGATIVE
Blood, UA: NEGATIVE
GLUCOSE UA: NEGATIVE
KETONES UA: NEGATIVE
LEUKOCYTES UA: NEGATIVE
NITRITE UA: NEGATIVE
PH UA: 5
Protein, UA: NEGATIVE
Spec Grav, UA: 1.025
Urobilinogen, UA: NEGATIVE

## 2014-07-27 NOTE — Telephone Encounter (Signed)
patient stated wanted to speak with spouse before scheduling the u/s appt at [facility] coming back here on 08/10/14

## 2014-07-29 ENCOUNTER — Encounter: Payer: Self-pay | Admitting: Obstetrics & Gynecology

## 2014-07-29 NOTE — Patient Instructions (Signed)

## 2014-07-29 NOTE — Progress Notes (Signed)
Subjective:    Stephanie Marshall is a 33 y.o. female being seen today for her obstetrical visit. She is at [redacted]w[redacted]d gestation. Patient reports no complaints. Fetal movement: normal.  Problem List Items Addressed This Visit     High   Supervision of normal first pregnancy   Relevant Orders      POCT urinalysis dipstick (Completed)    Other Visit Diagnoses   Abnormal findings on antenatal screening    -  Primary    Relevant Orders       US OB Follow Up      Patient Active Problem List   Diagnosis Date Noted  . Kidney abnormality of fetus on prenatal ultrasound 04/30/2014    Priority: High  . Supervision of normal first pregnancy 02/17/2014    Priority: High  . Routine general medical examination at a health care facility 09/11/2013  . Screening for malignant neoplasm of the cervix 09/11/2013  . Hidrosis 09/04/2013  . Candidal intertrigo 09/04/2013  . Hemorrhoids 09/04/2013  . Darkening of skin 09/04/2013  . Anemia 07/20/2013  . Other malaise and fatigue 07/20/2013  . Need for lipid screening 07/20/2013  . Unspecified vitamin D deficiency 07/20/2013  . Eczema 07/20/2013  . Overweight 07/20/2013  . Encounter for preconception consultation 07/20/2013   Objective:    BP 121/76  Pulse 90  Temp(Src) 97.8 F (36.6 C)  Wt 84.369 kg (186 lb)  LMP 12/04/2013 FHT:  140 BPM  Uterine Size: size equals dates  Presentation: cephalic     Assessment:    Pregnancy @ [redacted]w[redacted]d weeks   Plan:     labs reviewed, problem list updated   Orders Placed This Encounter  Procedures  . US OB Follow Up    Standing Status: Future     Number of Occurrences:      Standing Expiration Date: 09/27/2015    Scheduling Instructions:     First week in September.    Order Specific Question:  Reason for Exam (SYMPTOM  OR DIAGNOSIS REQUIRED)    Answer:  Growth/  Right Kidney    Order Specific Question:  Preferred imaging location?    Answer:  [facility]-Ultrasound  . POCT urinalysis dipstick    Follow  up in 2 Weeks.

## 2014-08-04 ENCOUNTER — Encounter: Payer: Self-pay | Admitting: Obstetrics & Gynecology

## 2014-08-09 ENCOUNTER — Ambulatory Visit (HOSPITAL_COMMUNITY): Payer: BC Managed Care – PPO

## 2014-08-10 ENCOUNTER — Encounter: Payer: Self-pay | Admitting: Obstetrics & Gynecology

## 2014-08-10 ENCOUNTER — Ambulatory Visit (INDEPENDENT_AMBULATORY_CARE_PROVIDER_SITE_OTHER): Payer: BC Managed Care – PPO | Admitting: Obstetrics & Gynecology

## 2014-08-10 VITALS — BP 119/84 | HR 90 | Temp 99.5°F | Wt 188.0 lb

## 2014-08-10 DIAGNOSIS — Z3400 Encounter for supervision of normal first pregnancy, unspecified trimester: Secondary | ICD-10-CM

## 2014-08-10 DIAGNOSIS — Z3403 Encounter for supervision of normal first pregnancy, third trimester: Secondary | ICD-10-CM

## 2014-08-10 LAB — POCT URINALYSIS DIPSTICK
Bilirubin, UA: NEGATIVE
Blood, UA: NEGATIVE
Glucose, UA: NEGATIVE
Ketones, UA: NEGATIVE
LEUKOCYTES UA: NEGATIVE
NITRITE UA: NEGATIVE
Protein, UA: NEGATIVE
Spec Grav, UA: 1.02
UROBILINOGEN UA: NEGATIVE
pH, UA: 5

## 2014-08-10 NOTE — Progress Notes (Signed)
Subjective:    Stephanie Marshall is a 33 y.o. female being seen today for her obstetrical visit. She is at [redacted]w[redacted]d gestation. Patient reports no complaints. Fetal movement: normal.  Problem List Items Addressed This Visit     High   Supervision of normal first pregnancy - Primary   Relevant Orders      POCT urinalysis dipstick      Strep B DNA probe     Patient Active Problem List   Diagnosis Date Noted  . Kidney abnormality of fetus on prenatal ultrasound 04/30/2014    Priority: High  . Supervision of normal first pregnancy 02/17/2014    Priority: High  . Routine general medical examination at a health care facility 09/11/2013  . Screening for malignant neoplasm of the cervix 09/11/2013  . Hidrosis 09/04/2013  . Candidal intertrigo 09/04/2013  . Hemorrhoids 09/04/2013  . Darkening of skin 09/04/2013  . Anemia 07/20/2013  . Other malaise and fatigue 07/20/2013  . Need for lipid screening 07/20/2013  . Unspecified vitamin D deficiency 07/20/2013  . Eczema 07/20/2013  . Overweight 07/20/2013  . Encounter for preconception consultation 07/20/2013   Objective:    BP 119/84  Pulse 90  Temp(Src) 99.5 F (37.5 C)  Wt 85.276 kg (188 lb)  LMP 12/04/2013 FHT:  140 BPM  Uterine Size: size equals dates  Presentation: cephalic     Assessment:    Pregnancy @ [redacted]w[redacted]d weeks   Plan:     labs reviewed, problem list updated   Orders Placed This Encounter  Procedures  . Strep B DNA probe  . POCT urinalysis dipstick    Follow up in 2 Weeks.

## 2014-08-10 NOTE — Patient Instructions (Signed)
Patient information: Group B streptococcus and pregnancy (Beyond the Basics)  Authors Karen M Puopolo, MD, PhD Carol J Baker, MD Section Editors Charles J Lockwood, MD Daniel J Sexton, MD Deputy Editor Vanessa A Barss, MD Disclosures  All topics are updated as new evidence becomes available and our peer review process is complete.  Literature review current through: Feb 2014.  This topic last updated: Jun 08, 2012.  INTRODUCTION - Group B streptococcus (GBS) is a bacterium that can cause serious infections in pregnant women and newborn babies. GBS is one of many types of streptococcal bacteria, sometimes called "strep." This article discusses GBS, its effect on pregnant women and infants, and ways to prevent complications of GBS. More detailed information about GBS is available by subscription. (See "Group B streptococcal infection in pregnant women".) WHAT IS GROUP B STREP INFECTION? - GBS is commonly found in the digestive system and the vagina. In healthy adults, GBS is not harmful and does not cause problems. But in pregnant women and newborn infants, being infected with GBS can cause serious illness. Approximately one in three to four pregnant women in the [place] carries GBS in their gastrointestinal system and/or in their vagina. Carrying GBS is not the same as being infected. Carriers are not sick and do not need treatment during pregnancy. There is no treatment that can stop you from carrying GBS.  Pregnant women who are carriers of GBS infrequently become infected with GBS. GBS can cause urinary tract infections, infection of the amniotic fluid (bag of water), and infection of the uterus after delivery. GBS infections during pregnancy may lead to preterm labor.  Pregnant women who carry GBS can pass on the bacteria to their newborns, and some of those babies become infected with GBS. Newborns who are infected with GBS can develop pneumonia (lung infection), septicemia (blood infection), or  meningitis (infection of the lining of the brain and spinal cord). These complications can be prevented by giving intravenous antibiotics during labor to any woman who is at risk of GBS infection. You are at risk of GBS infection if: You have a urine culture during your current pregnancy showing GBS  You have a vaginal and rectal culture during your current pregnancy showing GBS  You had an infant infected with GBS in the past GROUP B STREP PREVENTION - Most doctors and nurses recommend a urine culture early in your pregnancy to be sure that you do not have a bladder infection without symptoms. If you urine culture shows GBS or other bacteria, you may be treated with an antibiotic. If you have symptoms of urinary infection, such as pain with urination, any time during your pregnancy, a urine culture is done. If GBS grows from the urine culture, it should be treated with an antibiotic, and you should also receive intravenous antibiotics during labor. Expert groups recommend that all pregnant women have a GBS culture at 35 to 37 weeks of pregnancy. The culture is done by swabbing the vagina and rectum. If your GBS culture is positive, you will be given an intravenous antibiotic during labor. If you have preterm labor, the culture is done then and an intravenous antibiotic is given until the baby is born or the labor is stopped by your health care provider. If you have a positive GBS culture and you have an allergy to penicillin, be sure your doctor and nurse are aware of this allergy and tell them what happened with the allergy. If you had only a rash or itching, this   is not a serious allergy, and you can receive a common drug related to the penicillin. If you had a serious allergy (for example, trouble breathing, swelling of your face) you may need an additional test to determine which antibiotic should be used during labor. Being treated with an antibiotic during labor greatly reduces the chance that you or  your newborn will develop infections related to GBS. It is important to note that young infants up to age 3 months can also develop septicemia, meningitis and other serious infections from GBS. Being treated with an antibiotic during labor does not reduce the chance that your baby will develop this later type of infection. There is currently no known way of preventing this later-onset GBS disease. WHERE TO GET MORE INFORMATION - Your healthcare provider is the best source of information for questions and concerns related to your medical problem.  

## 2014-08-11 LAB — STREP B DNA PROBE: STREP GROUP B AG: DETECTED

## 2014-08-17 ENCOUNTER — Telehealth: Payer: Self-pay | Admitting: *Deleted

## 2014-08-17 ENCOUNTER — Ambulatory Visit (HOSPITAL_COMMUNITY): Payer: BC Managed Care – PPO

## 2014-08-17 NOTE — Telephone Encounter (Signed)
Pt placed call to office regarding ultrasound. Return call to pt.  Pt states that she has not been informed about any of her ultrasounds and doesn't know why she has been having follow up ultrasounds. Pt states that no one has reviewed results with her.  Pt states that it was mentioned that there was a missing kidney but no one has confirmed.  Pt states that in office she has been told that everything looks fine.  Pt was to have a follow up u/s today and it was cancelled due to pt not knowing about appt.  Pt states that when she called to cancel she was told that the hospital ordered it and she was not informed of appt.  Pt made aware of different needs of u/s based on findings on previous u/s.  Pt also made aware that it was confirmed on previous u/s that there is no right kidney.  Pt made aware that need for current follow up was for growth, making her aware that kidney may be reviewed as well.  Pt made aware that the neonatal team is recommended at delivery.  Pt made aware that there may be further studies once baby is delivered to check for kidney function.  Pt made aware infant studies would be based on delivery team findings.  Pt advised that it is her decision as to have a f/u u/s or not.  Pt states that at this time she does not wish to have a f/u scheduled.  Pt advised to discuss plans with Dr Delsa Sale at her next appt.  Pt states understanding.

## 2014-08-24 ENCOUNTER — Ambulatory Visit (INDEPENDENT_AMBULATORY_CARE_PROVIDER_SITE_OTHER): Payer: BC Managed Care – PPO | Admitting: Obstetrics & Gynecology

## 2014-08-24 ENCOUNTER — Encounter: Payer: Self-pay | Admitting: Obstetrics & Gynecology

## 2014-08-24 VITALS — BP 116/78 | HR 87 | Temp 98.0°F | Wt 191.8 lb

## 2014-08-24 DIAGNOSIS — Z3400 Encounter for supervision of normal first pregnancy, unspecified trimester: Secondary | ICD-10-CM

## 2014-08-24 DIAGNOSIS — Z3403 Encounter for supervision of normal first pregnancy, third trimester: Secondary | ICD-10-CM

## 2014-08-24 NOTE — Patient Instructions (Signed)
Group B Streptococcus Infection During Pregnancy Group B streptococcus (GBS) is a type of bacteria often found in healthy women. GBS is not the same as the bacteria that causes strep throat. You may have GBS in your vagina, rectum, or bladder. GBS does not spread through sexual contact, but it can be passed to a baby during childbirth. This can be dangerous for your baby. It is not dangerous to you and usually does not cause any symptoms. Your health care provider may test you for GBS when your pregnancy is between 35 and 37 weeks. GBS is dangerous only during birth, so there is no need to test for it earlier. It is possible to have GBS during pregnancy and never pass it to your baby. If your test results are positive for GBS, your health care provider may recommend giving you antibiotic medicine during delivery to make sure your baby stays healthy. RISK FACTORS You are more likely to pass GBS to your baby if:   Your water breaks (ruptured membrane) or you go into labor before 37 weeks.  Your water breaks 18 hours before you deliver.  You passed GBS during a previous pregnancy.  You have a urinary tract infection caused by GBS any time during pregnancy.  You have a fever during labor. SYMPTOMS Most women who have GBS do not have any symptoms. If you have a urinary tract infection caused by GBS, you might have frequent or painful urination and fever. Babies who get GBS usually show symptoms within 7 days of birth. Symptoms may include:   Breathing problems.  Heart and blood pressure problems.  Digestive and kidney problems. DIAGNOSIS Routine screening for GBS is recommended for all pregnant women. A health care provider takes a sample of the fluid in your vagina and rectum with a swab. It is then sent to a lab to be checked for GBS. A sample of your urine may also be checked for the bacteria.  TREATMENT If you test positive for GBS, you may need treatment with an antibiotic medicine during  labor. As soon as you go into labor, or as soon as your membranes rupture, you will get the antibiotic medicine through an IV access. You will continue to get the medicine until after you give birth. You do not need antibiotic medicine if you are having a cesarean delivery.If your baby shows signs or symptoms of GBS after birth, your baby can also be treated with an antibiotic medicine. HOME CARE INSTRUCTIONS   Take all antibiotic medicine as prescribed by your health care provider. Only take medicine as directed.   Continue with prenatal visits and care.   Keep all follow-up appointments.  SEEK MEDICAL CARE IF:   You have pain when you urinate.   You have to urinate frequently.   You have a fever.  SEEK IMMEDIATE MEDICAL CARE IF:   Your membranes rupture.  You go into labor. Document Released: 03/03/2008 Document Revised: 11/30/2013 Document Reviewed: 09/17/2013 [employer] Patient Information 2015 [employer]. This information is not intended to replace advice given to you by your health care provider. Make sure you discuss any questions you have with your health care provider.  

## 2014-08-29 NOTE — Progress Notes (Signed)
Subjective:    Stephanie Marshall is a 33 y.o. female being seen today for her obstetrical visit. She is at [redacted]w[redacted]d gestation. Patient reports no complaints. Fetal movement: normal.  Problem List Items Addressed This Visit     High   Supervision of normal first pregnancy - Primary   Relevant Orders      POCT urinalysis dipstick     Patient Active Problem List   Diagnosis Date Noted  . Kidney abnormality of fetus on prenatal ultrasound 04/30/2014    Priority: High  . Supervision of normal first pregnancy 02/17/2014    Priority: High  . Routine general medical examination at a health care facility 09/11/2013  . Screening for malignant neoplasm of the cervix 09/11/2013  . Hidrosis 09/04/2013  . Candidal intertrigo 09/04/2013  . Hemorrhoids 09/04/2013  . Darkening of skin 09/04/2013  . Anemia 07/20/2013  . Other malaise and fatigue 07/20/2013  . Need for lipid screening 07/20/2013  . Unspecified vitamin D deficiency 07/20/2013  . Eczema 07/20/2013  . Overweight 07/20/2013  . Encounter for preconception consultation 07/20/2013    Objective:    BP 116/78  Pulse 87  Temp(Src) 98 F (36.7 C)  Wt 87 kg (191 lb 12.8 oz)  LMP 12/04/2013 FHT: 140 BPM     Assessment:    Pregnancy @ [redacted]w[redacted]d weeks   Plan:   Plans for delivery: Vaginal anticipated; labs reviewed; problem list updated L&D discussion: symptoms of labor, discussed when to call, discussed what number to call, anesthetic/analgesic options reviewed  Postpartum supports and preparation: circumcision discussed and contraception plans discussed.  Follow up in 1 Week.

## 2014-08-30 LAB — POCT URINALYSIS DIPSTICK
Bilirubin, UA: NEGATIVE
Blood, UA: NEGATIVE
Glucose, UA: NEGATIVE
Ketones, UA: NEGATIVE
LEUKOCYTES UA: NEGATIVE
NITRITE UA: NEGATIVE
PH UA: 5
PROTEIN UA: NEGATIVE
Spec Grav, UA: 1.02
UROBILINOGEN UA: NEGATIVE

## 2014-09-01 ENCOUNTER — Ambulatory Visit (INDEPENDENT_AMBULATORY_CARE_PROVIDER_SITE_OTHER): Payer: BC Managed Care – PPO | Admitting: Obstetrics & Gynecology

## 2014-09-01 DIAGNOSIS — Z0000 Encounter for general adult medical examination without abnormal findings: Secondary | ICD-10-CM

## 2014-09-05 ENCOUNTER — Encounter: Payer: Self-pay | Admitting: Obstetrics & Gynecology

## 2014-09-05 ENCOUNTER — Ambulatory Visit (INDEPENDENT_AMBULATORY_CARE_PROVIDER_SITE_OTHER): Payer: BC Managed Care – PPO | Admitting: Obstetrics & Gynecology

## 2014-09-05 VITALS — BP 114/83 | HR 77 | Temp 98.0°F | Wt 191.0 lb

## 2014-09-05 DIAGNOSIS — Z3400 Encounter for supervision of normal first pregnancy, unspecified trimester: Secondary | ICD-10-CM

## 2014-09-05 DIAGNOSIS — Z3403 Encounter for supervision of normal first pregnancy, third trimester: Secondary | ICD-10-CM

## 2014-09-05 LAB — POCT URINALYSIS DIPSTICK
Bilirubin, UA: NEGATIVE
Blood, UA: NEGATIVE
Glucose, UA: NEGATIVE
Ketones, UA: NEGATIVE
LEUKOCYTES UA: NEGATIVE
NITRITE UA: NEGATIVE
PH UA: 6
PROTEIN UA: NEGATIVE
Spec Grav, UA: 1.015
Urobilinogen, UA: NEGATIVE

## 2014-09-05 NOTE — Progress Notes (Deleted)
Patient reports she is doing well 

## 2014-09-08 ENCOUNTER — Encounter: Payer: Self-pay | Admitting: Obstetrics & Gynecology

## 2014-09-08 NOTE — Progress Notes (Signed)
Subjective:    Stephanie Marshall is a 33 y.o. female being seen today for her obstetrical visit. She is at [redacted]w[redacted]d gestation. Patient reports no complaints. Fetal movement: normal.  Problem List Items Addressed This Visit     High   Supervision of normal first pregnancy - Primary   Relevant Orders      POCT urinalysis dipstick     Patient Active Problem List   Diagnosis Date Noted  . Kidney abnormality of fetus on prenatal ultrasound 04/30/2014    Priority: High  . Supervision of normal first pregnancy 02/17/2014    Priority: High  . Routine general medical examination at a health care facility 09/11/2013  . Screening for malignant neoplasm of the cervix 09/11/2013  . Hidrosis 09/04/2013  . Candidal intertrigo 09/04/2013  . Hemorrhoids 09/04/2013  . Darkening of skin 09/04/2013  . Anemia 07/20/2013  . Other malaise and fatigue 07/20/2013  . Need for lipid screening 07/20/2013  . Unspecified vitamin D deficiency 07/20/2013  . Eczema 07/20/2013  . Overweight 07/20/2013  . Encounter for preconception consultation 07/20/2013    Objective:    BP 114/83  Pulse 77  Temp(Src) 98 F (36.7 C)  Wt 86.637 kg (191 lb)  LMP 12/04/2013 FHT: 140 BPM     Assessment:    Pregnancy @ [redacted]w[redacted]d weeks   Plan:   Plans for delivery: Vaginal anticipated; labs reviewed; problem list updated   Follow up in 1 Week.

## 2014-09-08 NOTE — Patient Instructions (Signed)
Sudden Infant Death Syndrome (SIDS): Sleeping Position SIDS is the sudden death of a healthy infant. The cause of SIDS is not known. However, there are certain factors that put the baby at risk, such as:  Babies placed on their stomach or side to sleep.  The baby being born earlier than normal (prematurity).  Being of [place] American, Native [place], and [place] Native descent.  Being a female. SIDS is seen more often in female babies than in female babies.  Sleeping on a soft surface.  Overheating.  Having a mother who smokes or uses illegal drugs.  Being an infant of a mother who is very young.  Having poor prenatal care.  Babies that had a low weight at birth.  Abnormalities of the placenta, the organ that provides nutrition in the womb.  Babies born in the fall or winter months.  Recent respiratory tract infection. Although it is recommended that most babies should be put on their backs to sleep, some questions have arisen: IS THE SIDE POSITION AS EFFECTIVE AS THE BACK? The side position is not recommended because there is still an increased chance of SIDS compared to the back position. Your baby should be placed on his or her back every time he or she sleeps. ARE THERE ANY BABIES WHO SHOULD BE PLACED ON THEIR TUMMY FOR SLEEP? Babies with certain disorders have fewer problems when lying on their tummy. These babies include:  Infants with symptomatic gastroesophageal reflux (GERD). Reflux is usually less in the tummy position.  Babies with certain upper airway malformations, such as Robin syndrome. There are fewer occurrences of the airway being blocked when lying on the stomach. Before letting your baby sleep on his or her tummy, discuss with your health care provider. If your baby has one of the above problems, your health care provider will help you decide if the benefits of tummy sleeping are greater than the small increased risk for SIDS. Be sure to avoid overheating and  soft bedding as these risk factors are troublesome for belly sleeping infants. SHOULD HEALTHY BABIES EVER BE PLACED ON THE TUMMY? Having tummy time while the baby is awake is important for movement (motor) development. It can also lower the chance of a flattened head (positional plagiocephaly). Flattened head can be the result of spending too much time on their back. Tummy time when the baby is awake and watched by an adult is good for baby's development. WHICH SLEEPING POSITION IS BEST FOR A BABY BORN EARLY (PRE-TERM) AFTER LEAVING [place]? In the nursery, babies who are born early (pre-term) often receive care in a position lying on their backs. Once recovered and ready to leave the hospital, there is no reason to believe that they should be treated any differently than a baby who was born at term. Unless there are specific instructions to do otherwise, these babies should be placed on their backs to sleep. IN WHAT POSITION ARE FULL-TERM BABIES PUT TO SLEEP IN HOSPITAL NURSERIES? Unless there is a specific reason to do otherwise, babies are placed on their backs in hospital nurseries.  IF A BABY DOES NOT SLEEP WELL ON HIS OR HER BACK, IS IT OKAY TO TURN HIM OR HER TO A SIDE OR TUMMY POSITION? No. Because of the risk of SIDS, the side and tummy positions are not recommended. Positional preference appears to be a learned behavior among infants from birth to 58 to 44 months of age. Infants who are always placed on their backs will become used  to this position. If your baby is not sleeping well, look for possible reasons. For example, be sure to avoid overheating or the use of soft bedding. AT WHAT AGE CAN YOU STOP USING THE BACK POSITION FOR SLEEP? The peak risk for SIDS is age 68 weeks to 47 weeks. Although less common, it can occur up to 32 year of age. It is recommended that you place your baby on his or her back up to age 44 year.  DO I NEED TO KEEP CHECKING ON MY BABY AFTER LAYING HIM OR HER DOWN FOR  SLEEP IN A BACK-LYING POSITION?  No. Very young infants placed on their backs cannot roll onto their tummies. HOW SHOULD HOSPITALS PLACE BABIES DOWN FOR SLEEP IF THEY ARE READMITTED? As a general guideline, hospitalized infants should sleep on their backs just as they would at home. However, there may be a medical problem that would require a side or tummy position.  WILL BABIES ASPIRATE ON THEIR BACKS? There is no evidence that healthy babies are more likely to inhale stomach contents (have aspiration episodes) when they are on their backs. In the majority of the small number of reported cases of death due to aspiration, the infant's position at death, when known, was on his or her tummy. DOES SLEEPING ON THE BACK CAUSE BABIES TO HAVE FLAT HEADS? There is some suggestion that the incidence of babies developing a flat spot on their heads may have increased since the incidence of sleeping on their tummies has decreased. Usually, this is not a serious condition. This condition will disappear within several months after the baby begins to sit up. Flat spots can be avoided by altering the head position when the baby is sleeping on his or her back. Giving your baby tummy time also helps prevent the development of a flat head. SHOULD PRODUCTS BE USED TO KEEP BABIES ON THEIR BACKS OR SIDES DURING SLEEP? Although various devices have been sold to maintain babies in a back-lying position during sleep, their use is not recommended. Infants who sleep on their backs need no extra support. SHOULD SOFT SURFACES BE AVOIDED? Several studies indicate that soft sleeping surfaces increase the risk of SIDS in infants. It is unknown how soft a surface must be to pose a threat. A firm infant mattress with no more than a thin covering such as a sheet or rubberized pad between the infant and mattress is advised. Soft, plush, or bulky items, such as pillows, rolls of bedding, or cushions in the baby's sleeping environment are  strongly warned against. These items can come into close contact with the infant's face and might cause breathing problems.  DOES BED SHARING OR CO-SLEEPING DECREASE RISK? No. Bed sharing, while controversial, is associated with an increased risk of SIDS, especially when the mother smokes, when sleeping occurs on a couch or sofa, when there are multiple bed sharers, or when bed sharers have consumed alcohol. Sleeping in an approved crib or bassinet in the same room as the mother decreases risk of SIDS. CAN A PACIFIER DECREASE RISK? While it is not known exactly how, pacifier use during the first year of life decreases the risk of SIDS. Give your baby the pacifier when putting the baby down, but do not force a pacifier or place one in your baby's mouth once your baby has fallen asleep. Pacifiers should not have any sugary solutions applied to them and need to be cleaned regularly. Finally, if your baby is breastfeeding, it is beneficial to  delay use of a pacifier in order to firmly establish breastfeeding. Document Released: 11/19/2001 Document Revised: 04/11/2014 Document Reviewed: 06/26/2009 [facility] Patient Information 2015 [place], [place]. This information is not intended to replace advice given to you by your health care provider. Make sure you discuss any questions you have with your health care provider.

## 2014-09-11 ENCOUNTER — Encounter (HOSPITAL_COMMUNITY): Payer: Self-pay | Admitting: *Deleted

## 2014-09-11 ENCOUNTER — Inpatient Hospital Stay (HOSPITAL_COMMUNITY)
Admission: AD | Admit: 2014-09-11 | Discharge: 2014-09-15 | DRG: 766 | Disposition: A | Discharge status: Home / Self Care | Payer: BC Managed Care – PPO | Source: Ambulatory Visit | Attending: Obstetrics & Gynecology | Admitting: Obstetrics & Gynecology

## 2014-09-11 DIAGNOSIS — Z3403 Encounter for supervision of normal first pregnancy, third trimester: Secondary | ICD-10-CM

## 2014-09-11 DIAGNOSIS — IMO0002 Reserved for concepts with insufficient information to code with codable children: Secondary | ICD-10-CM | POA: Diagnosis not present

## 2014-09-11 DIAGNOSIS — O99824 Streptococcus B carrier state complicating childbirth: Secondary | ICD-10-CM | POA: Diagnosis present

## 2014-09-11 DIAGNOSIS — Z8249 Family history of ischemic heart disease and other diseases of the circulatory system: Secondary | ICD-10-CM

## 2014-09-11 DIAGNOSIS — O4292 Full-term premature rupture of membranes, unspecified as to length of time between rupture and onset of labor: Secondary | ICD-10-CM | POA: Diagnosis present

## 2014-09-11 DIAGNOSIS — O358XX0 Maternal care for other (suspected) fetal abnormality and damage, not applicable or unspecified: Secondary | ICD-10-CM

## 2014-09-11 DIAGNOSIS — O35EXX0 Maternal care for other (suspected) fetal abnormality and damage, fetal genitourinary anomalies, not applicable or unspecified: Secondary | ICD-10-CM

## 2014-09-11 DIAGNOSIS — Z3A40 40 weeks gestation of pregnancy: Secondary | ICD-10-CM | POA: Diagnosis present

## 2014-09-11 DIAGNOSIS — Z833 Family history of diabetes mellitus: Secondary | ICD-10-CM

## 2014-09-11 LAB — CBC
HCT: 36.2 % (ref 36.0–46.0)
Hemoglobin: 12.3 g/dL (ref 12.0–15.0)
MCH: 29.4 pg (ref 26.0–34.0)
MCHC: 34 g/dL (ref 30.0–36.0)
MCV: 86.6 fL (ref 78.0–100.0)
Platelets: 249 10*3/uL (ref 150–400)
RBC: 4.18 MIL/uL (ref 3.87–5.11)
RDW: 14.2 % (ref 11.5–15.5)
WBC: 10.6 10*3/uL — ABNORMAL HIGH (ref 4.0–10.5)

## 2014-09-11 LAB — RAPID HIV SCREEN (WH-MAU): SUDS RAPID HIV SCREEN: NONREACTIVE

## 2014-09-11 MED ORDER — PENICILLIN G POTASSIUM 5000000 UNITS IJ SOLR
2.5000 10*6.[IU] | INTRAVENOUS | Status: DC
  Administered 2014-09-12 (×4): 2.5 10*6.[IU] via INTRAVENOUS
  Filled 2014-09-11 (×10): qty 2.5

## 2014-09-11 MED ORDER — PENICILLIN G POTASSIUM 5000000 UNITS IJ SOLR
5.0000 10*6.[IU] | Freq: Once | INTRAVENOUS | Status: AC
  Administered 2014-09-11: 5 10*6.[IU] via INTRAVENOUS
  Filled 2014-09-11: qty 5

## 2014-09-11 MED ORDER — LACTATED RINGERS IV SOLN
INTRAVENOUS | Status: DC
  Administered 2014-09-11: 23:00:00 via INTRAVENOUS
  Administered 2014-09-12: 125 mL/h via INTRAVENOUS
  Administered 2014-09-12: 13:00:00 via INTRAVENOUS

## 2014-09-11 NOTE — MAU Note (Signed)
Pt to be admitted to L&D for pitocin augmentation.

## 2014-09-11 NOTE — MAU Note (Signed)
Pt presented to MAU with SROm that occurred at  1830. Pt states baby is active. Pt denies bleeding.

## 2014-09-12 ENCOUNTER — Encounter (HOSPITAL_COMMUNITY)
Admission: AD | Disposition: A | Discharge status: Home / Self Care | Payer: Self-pay | Source: Ambulatory Visit | Attending: Obstetrics & Gynecology

## 2014-09-12 ENCOUNTER — Inpatient Hospital Stay (HOSPITAL_COMMUNITY): Payer: BC Managed Care – PPO | Admitting: Anesthesiology

## 2014-09-12 ENCOUNTER — Encounter (HOSPITAL_COMMUNITY): Payer: Self-pay | Admitting: Emergency Medicine

## 2014-09-12 ENCOUNTER — Encounter (HOSPITAL_COMMUNITY): Payer: BC Managed Care – PPO | Admitting: Anesthesiology

## 2014-09-12 DIAGNOSIS — O99824 Streptococcus B carrier state complicating childbirth: Secondary | ICD-10-CM | POA: Diagnosis present

## 2014-09-12 DIAGNOSIS — Z833 Family history of diabetes mellitus: Secondary | ICD-10-CM | POA: Diagnosis not present

## 2014-09-12 DIAGNOSIS — O4292 Full-term premature rupture of membranes, unspecified as to length of time between rupture and onset of labor: Secondary | ICD-10-CM | POA: Diagnosis present

## 2014-09-12 DIAGNOSIS — Z8249 Family history of ischemic heart disease and other diseases of the circulatory system: Secondary | ICD-10-CM | POA: Diagnosis not present

## 2014-09-12 DIAGNOSIS — IMO0002 Reserved for concepts with insufficient information to code with codable children: Secondary | ICD-10-CM | POA: Diagnosis not present

## 2014-09-12 DIAGNOSIS — Z3A40 40 weeks gestation of pregnancy: Secondary | ICD-10-CM | POA: Diagnosis present

## 2014-09-12 DIAGNOSIS — Z3403 Encounter for supervision of normal first pregnancy, third trimester: Secondary | ICD-10-CM | POA: Diagnosis present

## 2014-09-12 LAB — TYPE AND SCREEN
ABO/RH(D): O POS
ANTIBODY SCREEN: NEGATIVE

## 2014-09-12 LAB — ABO/RH: ABO/RH(D): O POS

## 2014-09-12 LAB — RPR

## 2014-09-12 SURGERY — Surgical Case
Anesthesia: Epidural

## 2014-09-12 MED ORDER — OXYTOCIN 10 UNIT/ML IJ SOLN
INTRAMUSCULAR | Status: AC
  Filled 2014-09-12: qty 4

## 2014-09-12 MED ORDER — EPHEDRINE 5 MG/ML INJ
10.0000 mg | INTRAVENOUS | Status: DC | PRN
  Filled 2014-09-12: qty 2

## 2014-09-12 MED ORDER — OXYTOCIN 40 UNITS IN LACTATED RINGERS INFUSION - SIMPLE MED
1.0000 m[IU]/min | INTRAVENOUS | Status: DC
  Administered 2014-09-12: 2 m[IU]/min via INTRAVENOUS
  Filled 2014-09-12: qty 1000

## 2014-09-12 MED ORDER — LIDOCAINE HCL (PF) 1 % IJ SOLN
30.0000 mL | INTRAMUSCULAR | Status: DC | PRN
  Filled 2014-09-12: qty 30

## 2014-09-12 MED ORDER — DIPHENHYDRAMINE HCL 50 MG/ML IJ SOLN
12.5000 mg | INTRAMUSCULAR | Status: DC | PRN
  Administered 2014-09-12: 12.5 mg via INTRAVENOUS

## 2014-09-12 MED ORDER — PHENYLEPHRINE 40 MCG/ML (10ML) SYRINGE FOR IV PUSH (FOR BLOOD PRESSURE SUPPORT)
80.0000 ug | PREFILLED_SYRINGE | INTRAVENOUS | Status: DC | PRN
  Administered 2014-09-12: 10:00:00 via INTRAVENOUS
  Filled 2014-09-12: qty 2

## 2014-09-12 MED ORDER — MEPERIDINE HCL 25 MG/ML IJ SOLN: 6.2500 mg | INTRAMUSCULAR | Status: DC | PRN

## 2014-09-12 MED ORDER — NALOXONE HCL 0.4 MG/ML IJ SOLN: 0.4000 mg | INTRAMUSCULAR | Status: DC | PRN

## 2014-09-12 MED ORDER — OXYTOCIN 40 UNITS IN LACTATED RINGERS INFUSION - SIMPLE MED: 62.5000 mL/h | INTRAVENOUS | Status: DC

## 2014-09-12 MED ORDER — CEFAZOLIN SODIUM-DEXTROSE 2-3 GM-% IV SOLR
INTRAVENOUS | Status: DC | PRN
  Administered 2014-09-12: 2 g via INTRAVENOUS

## 2014-09-12 MED ORDER — METOCLOPRAMIDE HCL 5 MG/ML IJ SOLN
10.0000 mg | Freq: Once | INTRAMUSCULAR | Status: DC | PRN
Start: 2014-09-12 — End: 2014-09-12

## 2014-09-12 MED ORDER — SCOPOLAMINE 1 MG/3DAYS TD PT72
MEDICATED_PATCH | TRANSDERMAL | Status: AC
  Filled 2014-09-12: qty 1

## 2014-09-12 MED ORDER — OXYTOCIN 10 UNIT/ML IJ SOLN
40.0000 [IU] | INTRAVENOUS | Status: DC | PRN
  Administered 2014-09-12: 40 [IU] via INTRAVENOUS

## 2014-09-12 MED ORDER — LACTATED RINGERS IV SOLN: INTRAVENOUS | Status: AC

## 2014-09-12 MED ORDER — NALBUPHINE HCL 10 MG/ML IJ SOLN: 5.0000 mg | INTRAMUSCULAR | Status: DC | PRN

## 2014-09-12 MED ORDER — PHENYLEPHRINE 40 MCG/ML (10ML) SYRINGE FOR IV PUSH (FOR BLOOD PRESSURE SUPPORT)
80.0000 ug | PREFILLED_SYRINGE | INTRAVENOUS | Status: DC | PRN
  Filled 2014-09-12: qty 10
  Filled 2014-09-12: qty 2

## 2014-09-12 MED ORDER — SCOPOLAMINE 1 MG/3DAYS TD PT72
MEDICATED_PATCH | TRANSDERMAL | Status: DC | PRN
  Administered 2014-09-12: 1 via TRANSDERMAL

## 2014-09-12 MED ORDER — TERBUTALINE SULFATE 1 MG/ML IJ SOLN: 0.2500 mg | Freq: Once | INTRAMUSCULAR | Status: AC | PRN

## 2014-09-12 MED ORDER — DEXAMETHASONE SODIUM PHOSPHATE 10 MG/ML IJ SOLN
INTRAMUSCULAR | Status: DC | PRN
  Administered 2014-09-12: 10 mg via INTRAVENOUS

## 2014-09-12 MED ORDER — NALBUPHINE HCL 10 MG/ML IJ SOLN: 5.0000 mg | Freq: Once | INTRAMUSCULAR | Status: AC | PRN

## 2014-09-12 MED ORDER — MORPHINE SULFATE 0.5 MG/ML IJ SOLN
INTRAMUSCULAR | Status: AC
  Filled 2014-09-12: qty 10

## 2014-09-12 MED ORDER — ONDANSETRON HCL 4 MG/2ML IJ SOLN
4.0000 mg | Freq: Four times a day (QID) | INTRAMUSCULAR | Status: DC | PRN
  Filled 2014-09-12: qty 2

## 2014-09-12 MED ORDER — KETOROLAC TROMETHAMINE 30 MG/ML IJ SOLN
INTRAMUSCULAR | Status: AC
  Filled 2014-09-12: qty 1

## 2014-09-12 MED ORDER — ONDANSETRON HCL 4 MG/2ML IJ SOLN: 4.0000 mg | Freq: Three times a day (TID) | INTRAMUSCULAR | Status: DC | PRN

## 2014-09-12 MED ORDER — LACTATED RINGERS IV SOLN: 500.0000 mL | INTRAVENOUS | Status: DC | PRN

## 2014-09-12 MED ORDER — ONDANSETRON HCL 4 MG/2ML IJ SOLN
INTRAMUSCULAR | Status: AC
  Filled 2014-09-12: qty 2

## 2014-09-12 MED ORDER — FENTANYL CITRATE 0.05 MG/ML IJ SOLN
INTRAMUSCULAR | Status: AC
  Filled 2014-09-12: qty 2

## 2014-09-12 MED ORDER — FLEET ENEMA 7-19 GM/118ML RE ENEM: 1.0000 | ENEMA | RECTAL | Status: DC | PRN

## 2014-09-12 MED ORDER — SCOPOLAMINE 1 MG/3DAYS TD PT72
1.0000 | MEDICATED_PATCH | Freq: Once | TRANSDERMAL | Status: DC
  Filled 2014-09-12: qty 1

## 2014-09-12 MED ORDER — DEXAMETHASONE SODIUM PHOSPHATE 10 MG/ML IJ SOLN
INTRAMUSCULAR | Status: AC
  Filled 2014-09-12: qty 1

## 2014-09-12 MED ORDER — DIPHENHYDRAMINE HCL 25 MG PO CAPS
25.0000 mg | ORAL_CAPSULE | ORAL | Status: DC | PRN
  Administered 2014-09-13: 25 mg via ORAL

## 2014-09-12 MED ORDER — ACETAMINOPHEN 325 MG PO TABS: 650.0000 mg | ORAL_TABLET | ORAL | Status: DC | PRN

## 2014-09-12 MED ORDER — KETOROLAC TROMETHAMINE 30 MG/ML IJ SOLN
15.0000 mg | Freq: Once | INTRAMUSCULAR | Status: AC | PRN
  Administered 2014-09-12: 30 mg via INTRAVENOUS

## 2014-09-12 MED ORDER — SODIUM CHLORIDE 0.9 % IJ SOLN: 3.0000 mL | INTRAMUSCULAR | Status: DC | PRN

## 2014-09-12 MED ORDER — DIPHENHYDRAMINE HCL 50 MG/ML IJ SOLN
12.5000 mg | INTRAMUSCULAR | Status: DC | PRN
  Filled 2014-09-12 (×2): qty 1

## 2014-09-12 MED ORDER — OXYTOCIN BOLUS FROM INFUSION: 500.0000 mL | INTRAVENOUS | Status: DC

## 2014-09-12 MED ORDER — ONDANSETRON HCL 4 MG/2ML IJ SOLN
INTRAMUSCULAR | Status: DC | PRN
  Administered 2014-09-12: 4 mg via INTRAVENOUS

## 2014-09-12 MED ORDER — OXYCODONE-ACETAMINOPHEN 5-325 MG PO TABS: 1.0000 | ORAL_TABLET | ORAL | Status: DC | PRN

## 2014-09-12 MED ORDER — LIDOCAINE-EPINEPHRINE (PF) 2 %-1:200000 IJ SOLN
INTRAMUSCULAR | Status: DC | PRN
  Administered 2014-09-12: 5 mL via EPIDURAL

## 2014-09-12 MED ORDER — LIDOCAINE HCL (PF) 1 % IJ SOLN
INTRAMUSCULAR | Status: DC | PRN
  Administered 2014-09-12 (×4): 4 mL

## 2014-09-12 MED ORDER — LACTATED RINGERS IV SOLN
500.0000 mL | Freq: Once | INTRAVENOUS | Status: AC
  Administered 2014-09-12: 500 mL via INTRAVENOUS

## 2014-09-12 MED ORDER — KETOROLAC TROMETHAMINE 30 MG/ML IJ SOLN
30.0000 mg | Freq: Four times a day (QID) | INTRAMUSCULAR | Status: AC | PRN
  Administered 2014-09-13: 30 mg via INTRAVENOUS
  Filled 2014-09-12: qty 1

## 2014-09-12 MED ORDER — KETOROLAC TROMETHAMINE 30 MG/ML IJ SOLN: 30.0000 mg | Freq: Four times a day (QID) | INTRAMUSCULAR | Status: AC | PRN

## 2014-09-12 MED ORDER — LACTATED RINGERS IV SOLN
INTRAVENOUS | Status: DC | PRN
  Administered 2014-09-12: 19:00:00 via INTRAVENOUS

## 2014-09-12 MED ORDER — MORPHINE SULFATE (PF) 0.5 MG/ML IJ SOLN
INTRAMUSCULAR | Status: DC | PRN
  Administered 2014-09-12: 1 mg via INTRAVENOUS
  Administered 2014-09-12: 4 mg via EPIDURAL

## 2014-09-12 MED ORDER — SODIUM BICARBONATE 8.4 % IV SOLN
INTRAVENOUS | Status: AC
  Filled 2014-09-12: qty 50

## 2014-09-12 MED ORDER — NALOXONE HCL 1 MG/ML IJ SOLN
1.0000 ug/kg/h | INTRAMUSCULAR | Status: DC | PRN
  Filled 2014-09-12: qty 2

## 2014-09-12 MED ORDER — OXYCODONE-ACETAMINOPHEN 5-325 MG PO TABS: 2.0000 | ORAL_TABLET | ORAL | Status: DC | PRN

## 2014-09-12 MED ORDER — CITRIC ACID-SODIUM CITRATE 334-500 MG/5ML PO SOLN
30.0000 mL | ORAL | Status: DC | PRN
  Administered 2014-09-12: 30 mL via ORAL
  Filled 2014-09-12: qty 15

## 2014-09-12 MED ORDER — FENTANYL CITRATE 0.05 MG/ML IJ SOLN: 25.0000 ug | INTRAMUSCULAR | Status: DC | PRN

## 2014-09-12 MED ORDER — FENTANYL CITRATE 0.05 MG/ML IJ SOLN
INTRAMUSCULAR | Status: DC | PRN
  Administered 2014-09-12 (×4): 50 ug via INTRAVENOUS

## 2014-09-12 MED ORDER — LIDOCAINE-EPINEPHRINE (PF) 2 %-1:200000 IJ SOLN
INTRAMUSCULAR | Status: AC
  Filled 2014-09-12: qty 20

## 2014-09-12 MED ORDER — FENTANYL 2.5 MCG/ML BUPIVACAINE 1/10 % EPIDURAL INFUSION (WH - ANES)
14.0000 mL/h | INTRAMUSCULAR | Status: DC | PRN
  Administered 2014-09-12 (×2): 14 mL/h via EPIDURAL
  Filled 2014-09-12 (×2): qty 125

## 2014-09-12 SURGICAL SUPPLY — 41 items
BENZOIN TINCTURE PRP APPL 2/3 (GAUZE/BANDAGES/DRESSINGS) ×2 IMPLANT
CANISTER WOUND CARE 500ML ATS (WOUND CARE) IMPLANT
CLAMP CORD UMBIL (MISCELLANEOUS) IMPLANT
CLOTH BEACON ORANGE TIMEOUT ST (SAFETY) ×2 IMPLANT
CONTAINER PREFILL 10% NBF 15ML (MISCELLANEOUS) IMPLANT
COVER LIGHT HANDLE  1/PK (MISCELLANEOUS) ×2
COVER LIGHT HANDLE 1/PK (MISCELLANEOUS) ×2 IMPLANT
DRAPE SHEET LG 3/4 BI-LAMINATE (DRAPES) IMPLANT
DRESSING DISP NPWT PICO 4X12 (MISCELLANEOUS) IMPLANT
DRSG OPSITE POSTOP 4X10 (GAUZE/BANDAGES/DRESSINGS) ×2 IMPLANT
DRSG VAC ATS LRG SENSATRAC (GAUZE/BANDAGES/DRESSINGS) IMPLANT
DRSG VAC ATS SM SENSATRAC (GAUZE/BANDAGES/DRESSINGS) IMPLANT
DURAPREP 26ML APPLICATOR (WOUND CARE) ×2 IMPLANT
ELECT REM PT RETURN 9FT ADLT (ELECTROSURGICAL) ×2
ELECTRODE REM PT RTRN 9FT ADLT (ELECTROSURGICAL) ×1 IMPLANT
EXTRACTOR VACUUM M CUP 4 TUBE (SUCTIONS) IMPLANT
GLOVE BIO SURGEON STRL SZ 6.5 (GLOVE) ×2 IMPLANT
GOWN STRL REUS W/TWL LRG LVL3 (GOWN DISPOSABLE) ×4 IMPLANT
KIT ABG SYR 3ML LUER SLIP (SYRINGE) IMPLANT
NEEDLE HYPO 25X5/8 SAFETYGLIDE (NEEDLE) ×2 IMPLANT
NS IRRIG 1000ML POUR BTL (IV SOLUTION) ×2 IMPLANT
PACK C SECTION WH (CUSTOM PROCEDURE TRAY) ×2 IMPLANT
PAD OB MATERNITY 4.3X12.25 (PERSONAL CARE ITEMS) ×2 IMPLANT
RTRCTR C-SECT PINK 25CM LRG (MISCELLANEOUS) ×2 IMPLANT
SCRUB PCMX 4 OZ (MISCELLANEOUS) ×2 IMPLANT
STAPLER VISISTAT 35W (STAPLE) IMPLANT
STRIP CLOSURE SKIN 1/2X4 (GAUZE/BANDAGES/DRESSINGS) ×2 IMPLANT
SUT MNCRL 0 VIOLET CTX 36 (SUTURE) ×2 IMPLANT
SUT MNCRL AB 3-0 PS2 27 (SUTURE) ×2 IMPLANT
SUT MONOCRYL 0 CTX 36 (SUTURE) ×2
SUT PDS AB 0 CTX 36 PDP370T (SUTURE) IMPLANT
SUT PLAIN 0 NONE (SUTURE) IMPLANT
SUT VIC AB 0 CTXB 36 (SUTURE) ×8 IMPLANT
SUT VIC AB 2-0 CT1 (SUTURE) ×2 IMPLANT
SUT VIC AB 2-0 CT1 27 (SUTURE) ×1
SUT VIC AB 2-0 CT1 TAPERPNT 27 (SUTURE) ×1 IMPLANT
SUT VIC AB 2-0 SH 27 (SUTURE)
SUT VIC AB 2-0 SH 27XBRD (SUTURE) IMPLANT
TOWEL OR 17X24 6PK STRL BLUE (TOWEL DISPOSABLE) ×2 IMPLANT
TRAY FOLEY CATH 14FR (SET/KITS/TRAYS/PACK) IMPLANT
WATER STERILE IRR 1000ML POUR (IV SOLUTION) IMPLANT

## 2014-09-12 NOTE — MAU Note (Signed)
L&D nurse notified and pt. coming to L&D unit from [facility],

## 2014-09-12 NOTE — Anesthesia Procedure Notes (Signed)
Epidural Patient location during procedure: OB Start time: 09/12/2014 7:38 AM  Staffing Anesthesiologist: Geri Hepler Performed by: anesthesiologist   Preanesthetic Checklist Completed: patient identified, site marked, surgical consent, pre-op evaluation, timeout performed, IV checked, risks and benefits discussed and monitors and equipment checked  Epidural Patient position: sitting Prep: site prepped and draped and DuraPrep Patient monitoring: continuous pulse ox and blood pressure Approach: midline Location: L3-L4 Injection technique: LOR air  Needle:  Needle type: Tuohy  Needle gauge: 17 G Needle length: 9 cm and 9 Needle insertion depth: 7 cm Catheter type: closed end flexible Catheter size: 19 Gauge Catheter at skin depth: 12 cm Test dose: negative  Assessment Events: blood not aspirated, injection not painful, no injection resistance, negative IV test and no paresthesia  Additional Notes Discussed risk of headache, infection, bleeding, nerve injury and failed or incomplete block.  Patient voices understanding and wishes to proceed.  Epidural placed with moderate difficulty due to movement and cooperation.  No paresthesia.  Patient very anxious and moving throughout.  Coached by husband and repositioned to allow eventual placement.  No apparent complications.  Charlton Haws, MDReason for block:procedure for pain

## 2014-09-12 NOTE — Progress Notes (Signed)
Patient ID: Aidaly Cordner, female   DOB: [birth year], 33 y.o.   MRN: [id number] Lorella Gomez is a 33 y.o. G1P0 at [redacted]w[redacted]d by LMP admitted for PROM  Subjective: Comfortable  Objective: BP 130/74  Pulse 96  Temp(Src) 99.1 F (37.3 C) (Oral)  Resp 20  Ht 5' 4.5" (1.638 m)  Wt 86.183 kg (190 lb)  BMI 32.12 kg/m2  SpO2 98%  LMP 12/04/2013      FHT:  FHR: 140 bpm, variability: moderate,  accelerations:  Present,  decelerations:  Present  recent prolonged deceleration x 4 min UC:   irregular, every 5 minutes SVE:   Dilation: 5 Effacement (%): 80 Station: -2 Exam by:: [place] [facility]    Assessment / Plan: Augmentation of labor S/P a repeat prolonged fetal heart rate deceleration after amnioinfusion x 1 L Labor: latent labor Preeclampsia:  n/a Fetal Wellbeing:  Category II, variability/accelerations reassuring Pain Control:  Epidural I/D:  PROM, no signs/symptoms of overt chorioamnionitis Anticipated MOD:  A primary C/D is planned; risks/benefits reviewed  JACKSON-MOORE,Elvis Boot A 09/12/2014, 5:49 PM

## 2014-09-12 NOTE — Progress Notes (Signed)
Pt has been requesting not to have pitocin started, wanted to have cervix rechecked; after cervix checked and pt had made no progress pt said that she would be open to pitocin, but asked to wait an hour after sve to start pitocin.  Dr Delsa Sale notified and advised to start pitocin.

## 2014-09-12 NOTE — Transfer of Care (Signed)
Immediate Anesthesia Transfer of Care Note  Patient: Stephanie Marshall  Procedure(s) Performed: Procedure(s): CESAREAN SECTION (N/A)  Patient Location: PACU  Anesthesia Type:Epidural  Level of Consciousness: awake, alert , oriented and patient cooperative  Airway & Oxygen Therapy: Patient Spontanous Breathing  Post-op Assessment: Report given to PACU RN and Post -op Vital signs reviewed and stable  Post vital signs: Reviewed and stable  Complications: No apparent anesthesia complications

## 2014-09-12 NOTE — H&P (Signed)
Stephanie Marshall is a 33 y.o. female presenting for SROM. Maternal Medical History:  Reason for admission: Rupture of membranes.   Contractions: Frequency: irregular.   Perceived severity is strong.    Fetal activity: Perceived fetal activity is normal.    Prenatal complications: no prenatal complications Prenatal Complications - Diabetes: none.    OB History   Grav Para Term Preterm Abortions TAB SAB Ect Mult Living   1              Past Medical History  Diagnosis Date  . Eczema   . Vitamin D deficiency   . Anemia   . Dysmenorrhea   . Allergy    Past Surgical History  Procedure Laterality Date  . Polypectomy  12/2009    Uterine   . Orif ankle fracture  2005  . Bunionectomy Bilateral    Family History: family history includes Alcohol abuse in her maternal grandfather; Cancer in her mother; Diabetes in her paternal grandfather; Hyperlipidemia in her father; Hypertension in her father and mother. Social History:  reports that she has never smoked. She has never used smokeless tobacco. She reports that she does not drink alcohol or use illicit drugs.     Review of Systems  Constitutional: Negative for fever.  Eyes: Negative for blurred vision.  Respiratory: Negative for shortness of breath.   Gastrointestinal: Negative for vomiting.  Skin: Negative for rash.  Neurological: Negative for headaches.    Dilation: 1.5 Effacement (%): 50 Station: -2 Exam by:: [email or web address],rnc Blood pressure 124/57, pulse 85, temperature 98.4 F (36.9 C), temperature source Oral, resp. rate 20, height 5' 4.5" (1.638 m), weight 86.183 kg (190 lb), last menstrual period 12/04/2013, SpO2 99.00%. Maternal Exam:  Abdomen: not evaluated.  Introitus: Normal vulva. Amniotic fluid character: clear.  Cervix: Cervix evaluated by digital exam.     Fetal Exam Fetal Monitor Review: Baseline rate: 140.  Variability: moderate (6-25 bpm).   Pattern: variable decelerations and accelerations  present.    Fetal State Assessment: Category I - tracings are normal.     Physical Exam  Constitutional: She appears well-developed.  HENT:  Head: Normocephalic.  Neck: Neck supple. No thyromegaly present.  Cardiovascular: Normal rate and regular rhythm.   Respiratory: Breath sounds normal.  GI: Soft. Bowel sounds are normal.  Skin: No rash noted.    Prenatal labs: ABO, Rh: O/POS/-- (03/12 1307) Antibody: NEG (03/12 1307) Rubella: 2.56 (03/12 1307) RPR: NON REAC (10/04 2230)  HBsAg: NEGATIVE (03/12 1307)  HIV: NONREACTIVE (07/10 1403)  GBS: Detected (09/02 1531)   Assessment/Plan: Primigravida @ [redacted]w[redacted]d.  PROM.  Prodromal labor.  GBS positive.  Fetus with one kidney.  Category I FHT.  Admit Augment labor with low dose Pitocin per protocol PCN GBS prophylaxis   JACKSON-MOORE,Sherrina Zaugg A 09/12/2014, 8:40 AM

## 2014-09-12 NOTE — Op Note (Signed)
Cesarean Section Procedure Note   [name]   09/11/2014 - 09/12/2014  Indications: Repetitive, prolonged fetal heart rate decelerations, latent labor   Pre-operative Diagnosis: Intrauterine pregnancy at term, repetitive, prolonged fetal heart rate decelerations, latent labor   Post-operative Diagnosis: Same   Surgeon: Lahoma Crocker A  Assistants: none  Anesthesia: epidural  Procedure Details:  The patient was seen in the Holding Room. The risks, benefits, complications, treatment options, and expected outcomes were discussed with the patient. The patient concurred with the proposed plan, giving informed consent. The patient was identified as Stephanie Marshall and the procedure verified as C-Section Delivery. A Time Out was held and the above information confirmed.  After induction of anesthesia, the patient was draped and prepped in the usual sterile manner. A transverse incision was made and carried down through the subcutaneous tissue to the fascia. The fascial incision was made and extended transversely. The fascia was separated from the underlying rectus tissue superiorly. The peritoneum was identified and entered. The peritoneal incision was extended longitudinally. An Alexis retractor was placed into the incision. The utero-vesical peritoneal reflection was incised transversely and the bladder flap was bluntly freed from the lower uterine segment. A low transverse uterine incision was made. Delivered from cephalic presentation was a living newborn female infant(s). APGAR (1 MIN): 8   APGAR (5 MINS): 8   APGAR (10 MINS): 9    A cord ph was not sent. The umbilical cord was clamped and cut cord. A sample was obtained for evaluation. The placenta was removed Intact and appeared normal.  The uterine incision was closed with running locked sutures of 1-0 Monocryl. A second imbricating layer of the same suture was placed.  Hemostasis was observed. The parieto peritoneum was closed in a  running fashion with 2-0 Vicryl.  The fascia was then reapproximated with running sutures of 0 Vicryl.  The skin was closed with suture.  Instrument, sponge, and needle counts were correct prior the abdominal closure and were correct at the conclusion of the case.    Findings: Infant with nuchal/body cord   Estimated Blood Loss: 600 ml  Total IV Fluids: per Anesthesiology  Urine Output: per Anesthesiology  Specimens: None   Complications: no complications  Disposition: PACU - hemodynamically stable.  Maternal Condition: stable   Baby condition / location:  Couplet care / Skin to Skin    Signed: Surgeon(s): Lahoma Crocker, MD

## 2014-09-12 NOTE — Progress Notes (Signed)
Spoke with Dr. Delsa Sale. Notified of fhr changes, interventions. Says she will come over and place an IUPC for amnioinfusion.

## 2014-09-12 NOTE — Progress Notes (Signed)
Greyson Riccardi is a 33 y.o. G1P0 at [redacted]w[redacted]d by LMP admitted for PROM  Subjective: Comfortable  Objective: BP 111/65  Pulse 79  Temp(Src) 98.6 F (37 C) (Oral)  Resp 20  Ht 5' 4.5" (1.638 m)  Wt 86.183 kg (190 lb)  BMI 32.12 kg/m2  SpO2 98%  LMP 12/04/2013      FHT:  FHR: 140 bpm, variability: moderate,  accelerations:  Present,  decelerations:  Present  recent prolonged deceleration x 4 min UC:   irregular, every 5 minutes SVE:   Dilation: 2 Effacement (%): 60 Station: -2 Exam by:: m early rnc-ob    Assessment / Plan: Augmentation of labor  Labor: see above; prodromal labor Preeclampsia:  n/a Fetal Wellbeing:  Category II, variability/accelerations reassuring Pain Control:  Epidural I/D:  PROM, no signs/symptoms of overt chorioamnionitis Anticipated MOD:  NSVD Amnioinfusion; restart Pitocin after initial bolus; monitor closely JACKSON-MOORE,Ephraim Reichel A 09/12/2014, 11:18 AM

## 2014-09-12 NOTE — Anesthesia Preprocedure Evaluation (Addendum)
Anesthesia Evaluation  Patient identified by MRN, date of birth, ID band Patient awake    Reviewed: Allergy & Precautions, H&P , Patient's Chart, lab work & pertinent test results  Airway Mallampati: I TM Distance: >3 FB Neck ROM: full    Dental   Pulmonary  breath sounds clear to auscultation        Cardiovascular Rhythm:regular Rate:Normal     Neuro/Psych    GI/Hepatic   Endo/Other    Renal/GU      Musculoskeletal   Abdominal   Peds  Hematology  (+) anemia ,   Anesthesia Other Findings   Reproductive/Obstetrics (+) Pregnancy (fetal decelerations --> primary C/S)                         Anesthesia Physical Anesthesia Plan  ASA: II and emergent  Anesthesia Plan: Epidural   Post-op Pain Management:    Induction:   Airway Management Planned:   Additional Equipment:   Intra-op Plan:   Post-operative Plan:   Informed Consent: I have reviewed the patients History and Physical, chart, labs and discussed the procedure including the risks, benefits and alternatives for the proposed anesthesia with the patient or authorized representative who has indicated his/her understanding and acceptance.     Plan Discussed with: Surgeon and CRNA  Anesthesia Plan Comments:        Anesthesia Quick Evaluation

## 2014-09-12 NOTE — Anesthesia Postprocedure Evaluation (Signed)
  Anesthesia Post-op Note  Patient: Stephanie Marshall  Procedure(s) Performed: Procedure(s): CESAREAN SECTION (N/A)  Patient Location: PACU  Anesthesia Type:Epidural  Level of Consciousness: awake, alert  and oriented  Airway and Oxygen Therapy: Patient Spontanous Breathing  Post-op Pain: mild  Post-op Assessment: Post-op Vital signs reviewed, Patient's Cardiovascular Status Stable, Respiratory Function Stable, Patent Airway, No signs of Nausea or vomiting, Pain level controlled, No headache and No backache  Post-op Vital Signs: Reviewed and stable  Last Vitals:  Filed Vitals:   09/12/14 2015  BP: 114/74  Pulse: 99  Temp:   Resp: 21    Complications: No apparent anesthesia complications

## 2014-09-12 NOTE — MAU Note (Signed)
M.D. Given report on fetal HR and variables and one late decel. Dr. Delsa Sale aware that pt. is going to L&D.

## 2014-09-13 ENCOUNTER — Encounter (HOSPITAL_COMMUNITY): Payer: Self-pay | Admitting: Obstetrics & Gynecology

## 2014-09-13 LAB — CBC
HCT: 30.1 % — ABNORMAL LOW (ref 36.0–46.0)
Hemoglobin: 10.1 g/dL — ABNORMAL LOW (ref 12.0–15.0)
MCH: 29.2 pg (ref 26.0–34.0)
MCHC: 33.6 g/dL (ref 30.0–36.0)
MCV: 87 fL (ref 78.0–100.0)
PLATELETS: 210 10*3/uL (ref 150–400)
RBC: 3.46 MIL/uL — ABNORMAL LOW (ref 3.87–5.11)
RDW: 14.3 % (ref 11.5–15.5)
WBC: 22.9 10*3/uL — ABNORMAL HIGH (ref 4.0–10.5)

## 2014-09-13 MED ORDER — WITCH HAZEL-GLYCERIN EX PADS: 1.0000 "application " | MEDICATED_PAD | CUTANEOUS | Status: DC | PRN

## 2014-09-13 MED ORDER — FERROUS SULFATE 325 (65 FE) MG PO TABS
325.0000 mg | ORAL_TABLET | Freq: Two times a day (BID) | ORAL | Status: DC
Start: 2014-09-13 — End: 2014-09-15
  Administered 2014-09-13: 325 mg via ORAL
  Filled 2014-09-13 (×4): qty 1

## 2014-09-13 MED ORDER — SIMETHICONE 80 MG PO CHEW
80.0000 mg | CHEWABLE_TABLET | ORAL | Status: DC | PRN
  Administered 2014-09-14: 80 mg via ORAL

## 2014-09-13 MED ORDER — OXYTOCIN 40 UNITS IN LACTATED RINGERS INFUSION - SIMPLE MED: 62.5000 mL/h | INTRAVENOUS | Status: AC

## 2014-09-13 MED ORDER — MEASLES, MUMPS & RUBELLA VAC ~~LOC~~ INJ
0.5000 mL | INJECTION | Freq: Once | SUBCUTANEOUS | Status: DC
  Filled 2014-09-13: qty 0.5

## 2014-09-13 MED ORDER — ZOLPIDEM TARTRATE 5 MG PO TABS: 5.0000 mg | ORAL_TABLET | Freq: Every evening | ORAL | Status: DC | PRN

## 2014-09-13 MED ORDER — LACTATED RINGERS IV SOLN
INTRAVENOUS | Status: DC
  Administered 2014-09-13: 11:00:00 via INTRAVENOUS

## 2014-09-13 MED ORDER — TETANUS-DIPHTH-ACELL PERTUSSIS 5-2.5-18.5 LF-MCG/0.5 IM SUSP: 0.5000 mL | Freq: Once | INTRAMUSCULAR | Status: DC

## 2014-09-13 MED ORDER — PRENATAL MULTIVITAMIN CH
1.0000 | ORAL_TABLET | Freq: Every day | ORAL | Status: DC
  Filled 2014-09-13 (×2): qty 1

## 2014-09-13 MED ORDER — OXYCODONE-ACETAMINOPHEN 5-325 MG PO TABS
1.0000 | ORAL_TABLET | ORAL | Status: DC | PRN
  Filled 2014-09-13: qty 1

## 2014-09-13 MED ORDER — DIBUCAINE 1 % RE OINT: 1.0000 "application " | TOPICAL_OINTMENT | RECTAL | Status: DC | PRN

## 2014-09-13 MED ORDER — MAGNESIUM HYDROXIDE 400 MG/5ML PO SUSP: 30.0000 mL | ORAL | Status: DC | PRN

## 2014-09-13 MED ORDER — IBUPROFEN 600 MG PO TABS
600.0000 mg | ORAL_TABLET | Freq: Four times a day (QID) | ORAL | Status: DC
  Administered 2014-09-13 – 2014-09-15 (×8): 600 mg via ORAL
  Filled 2014-09-13 (×9): qty 1

## 2014-09-13 MED ORDER — ONDANSETRON HCL 4 MG PO TABS: 4.0000 mg | ORAL_TABLET | ORAL | Status: DC | PRN

## 2014-09-13 MED ORDER — DIPHENHYDRAMINE HCL 25 MG PO CAPS
25.0000 mg | ORAL_CAPSULE | Freq: Four times a day (QID) | ORAL | Status: DC | PRN
  Filled 2014-09-13: qty 1

## 2014-09-13 MED ORDER — SIMETHICONE 80 MG PO CHEW
80.0000 mg | CHEWABLE_TABLET | ORAL | Status: DC
  Administered 2014-09-13 – 2014-09-15 (×3): 80 mg via ORAL
  Filled 2014-09-13 (×3): qty 1

## 2014-09-13 MED ORDER — ONDANSETRON HCL 4 MG/2ML IJ SOLN
4.0000 mg | INTRAMUSCULAR | Status: DC | PRN
Start: 2014-09-13 — End: 2014-09-15

## 2014-09-13 MED ORDER — SENNOSIDES-DOCUSATE SODIUM 8.6-50 MG PO TABS
2.0000 | ORAL_TABLET | ORAL | Status: DC
  Administered 2014-09-14 (×2): 2 via ORAL
  Filled 2014-09-13 (×2): qty 2

## 2014-09-13 MED ORDER — OXYCODONE-ACETAMINOPHEN 5-325 MG PO TABS: 2.0000 | ORAL_TABLET | ORAL | Status: DC | PRN

## 2014-09-13 MED ORDER — LANOLIN HYDROUS EX OINT: 1.0000 "application " | TOPICAL_OINTMENT | CUTANEOUS | Status: DC | PRN

## 2014-09-13 NOTE — Progress Notes (Signed)
Subjective: Postpartum Day 1: Cesarean Delivery Patient reports tolerating PO.    Objective: Vital signs in last 24 hours: Temp:  [98.2 F (36.8 C)-99.1 F (37.3 C)] 98.5 F (36.9 C) (10/06 0559) Pulse Rate:  [63-116] 66 (10/06 0559) Resp:  [17-38] 18 (10/06 0559) BP: (76-130)/(29-80) 99/58 mmHg (10/06 0559) SpO2:  [95 %-100 %] 95 % (10/06 0400)  Physical Exam:  General: alert and no distress Lochia: appropriate Uterine Fundus: firm Incision: healing well DVT Evaluation: No evidence of DVT seen on physical exam.   Recent Labs  09/11/14 2230  HGB 12.3  HCT 36.2    Assessment/Plan: Status post Cesarean section. Doing well postoperatively.  Continue current care.  HARPER,CHARLES A 09/13/2014, 7:20 AM

## 2014-09-13 NOTE — Addendum Note (Signed)
Addendum created 09/13/14 0748 by Raenette Rover, CRNA   Modules edited: Notes Section   Notes Section:  File: [id number]

## 2014-09-13 NOTE — Anesthesia Postprocedure Evaluation (Addendum)
  Anesthesia Post-op Note  Patient: Stephanie Marshall  Procedure(s) Performed: Procedure(s): CESAREAN SECTION (N/A)  Patient Location: Mother/Baby  Anesthesia Type:Epidural  Level of Consciousness: awake, alert , oriented and patient cooperative  Airway and Oxygen Therapy: Patient Spontanous Breathing  Post-op Pain: mild  Post-op Assessment: Post-op Vital signs reviewed, Patient's Cardiovascular Status Stable, Respiratory Function Stable, Patent Airway, No headache, No backache, No residual numbness and No residual motor weakness  Post-op Vital Signs: Reviewed and stable  Last Vitals:  Filed Vitals:   09/13/14 0559  BP: 99/58  Pulse: 66  Temp: 36.9 C  Resp: 18    Complications: No apparent anesthesia complications

## 2014-09-13 NOTE — Addendum Note (Signed)
Addendum created 09/13/14 0745 by Raenette Rover, CRNA   Modules edited: Notes Section   Notes Section:  File: [id number]

## 2014-09-13 NOTE — Lactation Note (Signed)
This note was copied from the chart of [place]. Lactation Consultation Note  Patient Name: Boy Shamya Macfadden [id number] Date: 09/13/2014 Reason for consult: Initial assessment Baby is resting on mother's chest skin to skin. Mother reports baby had a feeding for 5 minutes last night but since then has not latched. She reports he shows cues, sucking his fist but when she puts him to breast he falls asleep. Mother has a lot of colostrum that is easily expressed when teaching mother hand expression. Waking techniques with baby and teaching related to positioning and latching done. Mother is very motivated to breast feed. Suck training exercises demonstrated on baby with a gloved finger and taught to mother. Baby became alert for feeding at the breast, he opens his mouth slightly and latches shallow. Assisted mother with depth and she was able to feel several strong tugs before he became disinterested. Demonstrated hand expression into a spoon and fed baby a few drops of colostrum. Mother asked if she should start pumping her breast. Recommended to continue to work with the baby at the breast with latching and do hand expression due to greater yield during this time. Spoon feed as needed.  If baby is not sustaining latch nor actively feeding later this evening to request an electric pump for her nurse to set up or hand pump. During the consultation, baby showed improvement with feeding. Mother to call RN for assist as needed. LC to follow. Mother informed of post-discharge support and given phone number to the lactation department, including services for phone call assistance; out-patient appointments; and breastfeeding support group. List of other breastfeeding resources in the community given in the handout. Encouraged mother to call for problems or concerns related to breastfeeding.  Maternal Data Has patient been taught Hand Expression?: Yes Does the patient have breastfeeding experience prior  to this delivery?: No  Feeding Feeding Type: Breast Fed Length of feed: 5 min (on and off breast)  LATCH Score/Interventions Latch: Repeated attempts needed to sustain latch, nipple held in mouth throughout feeding, stimulation needed to elicit sucking reflex. Intervention(s): Skin to skin;Teach feeding cues;Waking techniques Intervention(s): Assist with latch;Adjust position  Audible Swallowing: A few with stimulation  Type of Nipple: Everted at rest and after stimulation  Comfort (Breast/Nipple): Soft / non-tender     Hold (Positioning): Assistance needed to correctly position infant at breast and maintain latch. Intervention(s): Breastfeeding basics reviewed;Support Pillows;Position options;Skin to skin  LATCH Score: 7  Lactation Tools Discussed/Used     Consult Status Consult Status: Follow-up Date: 09/14/14 Follow-up type: In-patient    Stana Bunting M 09/13/2014, 1:15 PM

## 2014-09-14 ENCOUNTER — Encounter: Payer: BC Managed Care – PPO | Admitting: Obstetrics & Gynecology

## 2014-09-14 NOTE — Progress Notes (Signed)
Subjective: Postpartum Day 2: Cesarean Delivery Patient reports tolerating PO, + flatus and no problems voiding.    Objective: Vital signs in last 24 hours: Temp:  [98.1 F (36.7 C)-98.2 F (36.8 C)] 98.2 F (36.8 C) (10/07 0537) Pulse Rate:  [70-92] 70 (10/07 0537) Resp:  [16-20] 18 (10/07 0537) BP: (99-108)/(50-61) 99/51 mmHg (10/07 0537) SpO2:  [99 %] 99 % (10/06 1850)  Physical Exam:  General: alert and no distress Lochia: appropriate Uterine Fundus: firm Incision: healing well DVT Evaluation: No evidence of DVT seen on physical exam.   Recent Labs  09/11/14 2230 09/13/14 0920  HGB 12.3 10.1*  HCT 36.2 30.1*    Assessment/Plan: Status post Cesarean section. Doing well postoperatively.  Continue current care.  Beau Vanduzer A 09/14/2014, 6:01 AM

## 2014-09-14 NOTE — Lactation Note (Signed)
This note was copied from the chart of [name]. Lactation Consultation Note Follow up visit at 68 hours of age.  Baby has had 7 feedings and 2 were only 5 minutes each in the past 24 hours, with 3 voids and 2 stools.   Mom reports baby falls asleep at the breast and only sucks on and off.  Mom also reports peds. Is concerned about tongue tie.  Visual short tight frenulum near tip of heart shaped tongue noted when baby cried.  Baby has some mobility that extends to lower gum line.  Mom attempts latch offering very shallow latch with mouth barely open and baby sucking.  Encouraged mom to wait for baby to open mouth wide and use nipple to touch nose and lips to encouraged wide mouth for deep latch.  Baby is not active at breast attempt.  Held in sitting position to waken baby and he puts fits to mouth.  Several attempt to get a deep wide latch and then baby required constant stimulation to keep active.  Noted 4 good swallows and periods of good strong jaw excursions.  Baby did maintain latch for 15 minutes with stimulation.  Mom denies pain at this time.  Encouraged mom to post pump for 15 minutes and hand express to collect EBM to give back to baby.  Mom has syringe in room to offer feedings.  Mom is complaining of intense cramping post breastfeeding, reported to [place] and encouraged mom to take pain meds as needed.  FOB at bedside supportive.  Mom did become upset and "doesn't want anything to be wrong with the baby."  Encouragement given.  Baby should have follow up on tongue tie for possible revision due to mobility limitations and concerns on how well baby can stimulate milk production and empty breast at feedings.  Mom to call for assist as needed.    Patient Name: Stephanie Marshall DTOIZ'T Date: 09/14/2014 Reason for consult: Follow-up assessment;Difficult latch   Maternal Data    Feeding Feeding Type: Breast Fed Length of feed: 15 min  LATCH Score/Interventions Latch: Repeated  attempts needed to sustain latch, nipple held in mouth throughout feeding, stimulation needed to elicit sucking reflex. Intervention(s): Skin to skin;Teach feeding cues;Waking techniques Intervention(s): Breast compression;Breast massage;Assist with latch;Adjust position  Audible Swallowing: A few with stimulation  Type of Nipple: Everted at rest and after stimulation  Comfort (Breast/Nipple): Soft / non-tender     Hold (Positioning): Assistance needed to correctly position infant at breast and maintain latch. Intervention(s): Skin to skin;Position options;Support Pillows;Breastfeeding basics reviewed  LATCH Score: 7  Lactation Tools Discussed/Used     Consult Status Consult Status: Follow-up Date: 09/15/14 Follow-up type: In-patient    Shoptaw, Justine Null 09/14/2014, 5:46 PM

## 2014-09-15 MED ORDER — IBUPROFEN 600 MG PO TABS: 600.0000 mg | ORAL_TABLET | Freq: Four times a day (QID) | ORAL | Status: DC | PRN

## 2014-09-15 MED ORDER — OXYCODONE-ACETAMINOPHEN 5-325 MG PO TABS: 2.0000 | ORAL_TABLET | ORAL | Status: DC | PRN

## 2014-09-15 NOTE — Progress Notes (Signed)
Subjective: Postpartum Day 3: Cesarean Delivery Patient reports tolerating PO, + flatus, + BM and no problems voiding.    Objective: Vital signs in last 24 hours: Temp:  [98.1 F (36.7 C)-98.2 F (36.8 C)] 98.2 F (36.8 C) (10/08 0540) Pulse Rate:  [67-68] 68 (10/08 0540) Resp:  [18] 18 (10/08 0540) BP: (105-113)/(63-67) 113/63 mmHg (10/08 0540)  Physical Exam:  General: alert and no distress Lochia: appropriate Uterine Fundus: firm Incision: healing well DVT Evaluation: No evidence of DVT seen on physical exam.   Recent Labs  09/13/14 0920  HGB 10.1*  HCT 30.1*    Assessment/Plan: Status post Cesarean section. Doing well postoperatively.  Discharge home with standard precautions and return to clinic in 2 weeks.  Antonie Borjon A 09/15/2014, 8:23 AM

## 2014-09-15 NOTE — Discharge Instructions (Signed)
Before [facility] Ask any questions about feeding, diapering, and baby care before you leave the hospital. Ask again if you do not understand. Ask when you need to see the doctor again. There are several things you must have before your baby comes home.  Infant car seat.  Crib.  Do not let your baby sleep in a bed with you or anyone else.  If you do not have a bed for your baby, ask the doctor what you can use that will be safe for the baby to sleep in. Infant feeding supplies:  6 to 8 bottles (8 ounce size).  6 to 8 nipples.  Measuring cup.  Measuring tablespoon.  Bottle brush.  Sterilizer (or use any large pan or kettle with a lid).  Formula that contains iron.  A way to boil and cool water. Breastfeeding supplies:  Breast pump.  Nipple cream. Clothing:  24 to 36 cloth diapers and waterproof diaper covers or a box of disposable diapers. You may need as many as 10 to 12 diapers per day.  3 onesies (other clothing will depend on the time of year and the weather).  3 receiving blankets.  3 baby pajamas or gowns.  3 bibs. Bath equipment:  Mild soap.  Petroleum jelly. No baby oil or powder.  Soft cloth towel and washcloth.  Cotton balls.  Separate bath basin for baby. Only sponge bathe until umbilical cord and circumcision are healed. Other supplies:  Thermometer and bulb syringe (ask the hospital to send them home with you). Ask your doctor about how you should take your baby's temperature.  One to two pacifiers. Prepare for an emergency:  Know how to get to the hospital and know where to admit your baby.  Put all doctor numbers near your house phone and in your cell phone if you have one. Prepare your family:  Talk with siblings about the baby coming home and how they feel about it.  Decide how you want to handle visitors and other family members.  Take offers for help with the baby. You will need time to adjust. Know when to call the  doctor.  GET HELP RIGHT AWAY IF:  Your baby's temperature is greater than 100.40F (38C).  The soft spot on your baby's head starts to bulge.  Your baby is crying with no tears or has no wet diapers for 6 hours.  Your baby has rapid breathing.  Your baby is not as alert. Document Released: 11/07/2008 Document Revised: 04/11/2014 Document Reviewed: 02/14/2011 [facility] Patient Information 2015 [place], [place]. This information is not intended to replace advice given to you by your health care provider. Make sure you discuss any questions you have with your health care provider.

## 2014-09-15 NOTE — Discharge Summary (Signed)
Obstetric Discharge Summary Reason for Admission: rupture of membranes Prenatal Procedures: ultrasound Intrapartum Procedures: cesarean: low cervical, transverse Postpartum Procedures: none Complications-Operative and Postpartum: none Hemoglobin  Date Value Ref Range Status  09/13/2014 10.1* 12.0 - 15.0 g/dL Final     DELTA CHECK NOTED     REPEATED TO VERIFY     HCT  Date Value Ref Range Status  09/13/2014 30.1* 36.0 - 46.0 % Final    Physical Exam:  General: alert and no distress Lochia: appropriate Uterine Fundus: firm Incision: healing well DVT Evaluation: No evidence of DVT seen on physical exam.  Discharge Diagnoses: Term Pregnancy-delivered  Discharge Information: Date: 09/15/2014 Activity: pelvic rest Diet: routine Medications: PNV, Ibuprofen, Colace and Percocet Condition: stable Instructions: refer to practice specific booklet Discharge to: home Follow-up Information   Follow up with Lahoma Crocker A, MD. Schedule an appointment as soon as possible for a visit in 2 weeks.   Specialty:  Obstetrics and Gynecology   Contact information:   [place]  [address] [phone]       Newborn Data: Live born female  Birth Weight: 6 lb 12.8 oz (3084 g) APGAR: 8, 8  Home with mother.  HARPER,CHARLES A 09/15/2014, 8:28 AM

## 2014-09-15 NOTE — Progress Notes (Signed)
Clinical Social Work Department BRIEF PSYCHOSOCIAL ASSESSMENT 09/15/2014  Patient:  Stephanie Marshall, Stephanie Marshall     Account Number:  [id number]     Admit date:  09/11/2014  Clinical Social Worker:  Lucita Ferrara, CLINICAL SOCIAL WORKER  Date/Time:  09/15/2014 10:30 AM  Referred by:  RN  Date Referred:  09/14/2014 Referred for  Other - See comment   Other Referral:   RN noted that FOB was speaking for the MOB and was refusing her medications for her.   Interview type:  Family Other interview type:    PSYCHOSOCIAL DATA Living Status:  FAMILY Admitted from facility:   Level of care:   Primary support name:   Primary support relationship to patient:  SPOUSE Degree of support available:   MOB and FOB live together.    CURRENT CONCERNS Current Concerns  None Noted   Other Concerns:    SOCIAL WORK ASSESSMENT / PLAN CSW met with the MOB in her room in order to complete the assessment. MOB provided consent for the FOB to be present for the visit.  MOB and FOB expressed excitement upon their pending discharge, and shared that they are looking forward to their transition home with their newborn.  MOB presented in a full range in affect and smiled frequently. MOB answered questions for herself related to how she feels as she becomes a mother for the first time, and CSW did not note any relational stress or controlling dynamics between the MOB and the FOB.  Due to CSW not identifying any dynamics, CSW did not confront MOB about the FOB's behaviors.   CSW did inquire about supports, and MOB and FOB confirmed feeling well supported by their family and friends.  MOB did not identify any acute psychosocial stressors that may negatively impact her transition into the postpartum period. CSW provided education on postpartum depression, and MOB was receptive.  MOB denied mental health history and denied mood disturbances during her pregnancy.  MOB was educated on signs and symptoms of the baby blues and  postpartum depression, and was agreeable to notifying medical providers if she experiences symptoms.   No barriers to discharge.   Assessment/plan status:  No Further Intervention Required Other assessment/ plan:   CSW provided education on postpartum depressoin and normative feelings associated with role transition.   Information/referral to community resources:   No referrals needed at this time.    PATIENT'S/FAMILY'S RESPONSE TO PLAN OF CARE: MOB and FOB were receptive to the visit, were agreeable to contact medical providers if symptoms of postpartum depression are experienced, and thanked CSW for the visit.

## 2014-09-16 ENCOUNTER — Telehealth: Payer: Self-pay | Admitting: *Deleted

## 2014-09-16 NOTE — Telephone Encounter (Signed)
Patient states she was discharged from the hospital today and was not given any instructions in how to care for her C-Section incision. Discussed C-section post op care with the patient. Patient verbalized understanding. Encouraged patient to keep her appointment for her 2 week post partum.

## 2014-09-19 ENCOUNTER — Ambulatory Visit (INDEPENDENT_AMBULATORY_CARE_PROVIDER_SITE_OTHER): Payer: BC Managed Care – PPO | Admitting: Obstetrics & Gynecology

## 2014-09-19 ENCOUNTER — Encounter: Payer: Self-pay | Admitting: Obstetrics & Gynecology

## 2014-09-19 NOTE — Progress Notes (Signed)
Subjective:     Stephanie Marshall is a 33 y.o. female who presents for a postpartum visit. She is 1 weeks postpartum following a low cervical transverse Cesarean section. I have fully reviewed the prenatal and intrapartum course. The delivery was at term.  Outcome: primary cesarean section, low transverse incision.  Postpartum course has been remarkable for scant blood-tinged drainage from the incision. Baby's course has been unremarkable.  Bowel function is normal. Bladder function is normal. Patient is not sexually active. Contraception method planned is condoms.  Postpartum depression screening: negative.  Tobacco, alcohol and substance abuse history reviewed.  Adult immunizations reviewed including TDAP, rubella and varicella.  The following portions of the patient's history were reviewed and updated as appropriate: allergies, current medications, past family history, past medical history, past social history, past surgical history and problem list.  Review of Systems Pertinent items are noted in HPI.   Objective:    BP 128/86  Pulse 79  Temp(Src) 98.8 F (37.1 C)  Ht 5' 4.5" (1.638 m)  Wt 78.926 kg (174 lb)  BMI 29.42 kg/m2  LMP 12/04/2013  Breastfeeding? Yes        Incision: small induration; suture knot removed; superficial separation at left margin--silver nitrate applied   Assessment:     Normal postpartum exam.  Plan:   Follow up in: 1 month or as needed.

## 2014-09-19 NOTE — Patient Instructions (Signed)
Contraception Choices Contraception (birth control) is the use of any methods or devices to prevent pregnancy. Below are some methods to help avoid pregnancy. HORMONAL METHODS   Contraceptive implant. This is a thin, plastic tube containing progesterone hormone. It does not contain estrogen hormone. Your health care provider inserts the tube in the inner part of the upper arm. The tube can remain in place for up to 3 years. After 3 years, the implant must be removed. The implant prevents the ovaries from releasing an egg (ovulation), thickens the cervical mucus to prevent sperm from entering the uterus, and thins the lining of the inside of the uterus.  Progesterone-only injections. These injections are given every 3 months by your health care provider to prevent pregnancy. This synthetic progesterone hormone stops the ovaries from releasing eggs. It also thickens cervical mucus and changes the uterine lining. This makes it harder for sperm to survive in the uterus.  Birth control pills. These pills contain estrogen and progesterone hormone. They work by preventing the ovaries from releasing eggs (ovulation). They also cause the cervical mucus to thicken, preventing the sperm from entering the uterus. Birth control pills are prescribed by a health care provider.Birth control pills can also be used to treat heavy periods.  Minipill. This type of birth control pill contains only the progesterone hormone. They are taken every day of each month and must be prescribed by your health care provider.  Birth control patch. The patch contains hormones similar to those in birth control pills. It must be changed once a week and is prescribed by a health care provider.  Vaginal ring. The ring contains hormones similar to those in birth control pills. It is left in the vagina for 3 weeks, removed for 1 week, and then a new one is put back in place. The patient must be comfortable inserting and removing the ring  from the vagina.A health care provider's prescription is necessary.  Emergency contraception. Emergency contraceptives prevent pregnancy after unprotected sexual intercourse. This pill can be taken right after sex or up to 5 days after unprotected sex. It is most effective the sooner you take the pills after having sexual intercourse. Most emergency contraceptive pills are available without a prescription. Check with your pharmacist. Do not use emergency contraception as your only form of birth control. BARRIER METHODS   Female condom. This is a thin sheath (latex or rubber) that is worn over the penis during sexual intercourse. It can be used with spermicide to increase effectiveness.  Female condom. This is a soft, loose-fitting sheath that is put into the vagina before sexual intercourse.  Diaphragm. This is a soft, latex, dome-shaped barrier that must be fitted by a health care provider. It is inserted into the vagina, along with a spermicidal jelly. It is inserted before intercourse. The diaphragm should be left in the vagina for 6 to 8 hours after intercourse.  Cervical cap. This is a round, soft, latex or plastic cup that fits over the cervix and must be fitted by a health care provider. The cap can be left in place for up to 48 hours after intercourse.  Sponge. This is a soft, circular piece of polyurethane foam. The sponge has spermicide in it. It is inserted into the vagina after wetting it and before sexual intercourse.  Spermicides. These are chemicals that kill or block sperm from entering the cervix and uterus. They come in the form of creams, jellies, suppositories, foam, or tablets. They do not require a   prescription. They are inserted into the vagina with an applicator before having sexual intercourse. The process must be repeated every time you have sexual intercourse. INTRAUTERINE CONTRACEPTION  Intrauterine device (IUD). This is a T-shaped device that is put in a woman's uterus  during a menstrual period to prevent pregnancy. There are 2 types:  Copper IUD. This type of IUD is wrapped in copper wire and is placed inside the uterus. Copper makes the uterus and fallopian tubes produce a fluid that kills sperm. It can stay in place for 10 years.  Hormone IUD. This type of IUD contains the hormone progestin (synthetic progesterone). The hormone thickens the cervical mucus and prevents sperm from entering the uterus, and it also thins the uterine lining to prevent implantation of a fertilized egg. The hormone can weaken or kill the sperm that get into the uterus. It can stay in place for 3-5 years, depending on which type of IUD is used. PERMANENT METHODS OF CONTRACEPTION  Female tubal ligation. This is when the woman's fallopian tubes are surgically sealed, tied, or blocked to prevent the egg from traveling to the uterus.  Hysteroscopic sterilization. This involves placing a small coil or insert into each fallopian tube. Your doctor uses a technique called hysteroscopy to do the procedure. The device causes scar tissue to form. This results in permanent blockage of the fallopian tubes, so the sperm cannot fertilize the egg. It takes about 3 months after the procedure for the tubes to become blocked. You must use another form of birth control for these 3 months.  Female sterilization. This is when the female has the tubes that carry sperm tied off (vasectomy).This blocks sperm from entering the vagina during sexual intercourse. After the procedure, the man can still ejaculate fluid (semen). NATURAL PLANNING METHODS  Natural family planning. This is not having sexual intercourse or using a barrier method (condom, diaphragm, cervical cap) on days the woman could become pregnant.  Calendar method. This is keeping track of the length of each menstrual cycle and identifying when you are fertile.  Ovulation method. This is avoiding sexual intercourse during ovulation.  Symptothermal  method. This is avoiding sexual intercourse during ovulation, using a thermometer and ovulation symptoms.  Post-ovulation method. This is timing sexual intercourse after you have ovulated. Regardless of which type or method of contraception you choose, it is important that you use condoms to protect against the transmission of sexually transmitted infections (STIs). Talk with your health care provider about which form of contraception is most appropriate for you. Document Released: 11/25/2005 Document Revised: 11/30/2013 Document Reviewed: 05/20/2013 [employer] Patient Information 2015 [employer]. This information is not intended to replace advice given to you by your health care provider. Make sure you discuss any questions you have with your health care provider.  

## 2014-09-26 ENCOUNTER — Ambulatory Visit: Payer: BC Managed Care – PPO | Admitting: Obstetrics & Gynecology

## 2014-10-10 ENCOUNTER — Encounter: Payer: Self-pay | Admitting: Obstetrics & Gynecology

## 2014-10-17 ENCOUNTER — Ambulatory Visit (INDEPENDENT_AMBULATORY_CARE_PROVIDER_SITE_OTHER): Payer: BC Managed Care – PPO | Admitting: Obstetrics & Gynecology

## 2014-10-17 ENCOUNTER — Encounter: Payer: Self-pay | Admitting: Obstetrics & Gynecology

## 2014-10-17 VITALS — BP 107/74 | HR 89 | Temp 98.9°F | Ht 64.0 in | Wt 165.2 lb

## 2014-10-17 DIAGNOSIS — Z23 Encounter for immunization: Secondary | ICD-10-CM

## 2014-10-17 MED ORDER — TETANUS-DIPHTH-ACELL PERTUSSIS 5-2.5-18.5 LF-MCG/0.5 IM SUSP: 0.5000 mL | Freq: Once | INTRAMUSCULAR | Status: DC

## 2014-10-17 NOTE — Patient Instructions (Signed)

## 2014-10-17 NOTE — Progress Notes (Signed)
Patient ID: Stephanie Marshall, female   DOB: May 08, 1981, 33 y.o.   MRN: [id number] Subjective:     Stephanie Marshall is a 33 y.o. female who presents for a postpartum visit. She is 5 weeks postpartum following a low cervical transverse Cesarean section. I have fully reviewed the prenatal and intrapartum course. The delivery was at term. Outcome: primary cesarean section, low transverse incision. Anesthesia: epidural. Postpartum course has been uneventful. Baby's course has been unremarkable. Baby is feeding by breast. Bleeding no bleeding. Bowel function is normal. Bladder function is normal. Patient is not sexually active. Contraception method is abstinence. Postpartum depression screening: negative.  Tobacco, alcohol and substance abuse history reviewed.  Adult immunizations reviewed including TDAP, rubella and varicella.  The following portions of the patient's history were reviewed and updated as appropriate: allergies, current medications, past family history, past medical history, past social history, past surgical history and problem list.  Review of Systems Pertinent items are noted in HPI.   Objective:    BP 107/74 mmHg  Pulse 89  Temp(Src) 98.9 F (37.2 C)  Ht 5\' 4"  (1.626 m)  Wt 74.934 kg (165 lb 3.2 oz)  BMI 28.34 kg/m2  Breastfeeding? Yes        General:   alert  Skin:   no rash or abnormalities  Lungs:   clear to auscultation bilaterally  Heart:   regular rate and rhythm, S1, S2 normal, no murmur, click, rub or gallop  Abdomen:  normal findings: no organomegaly, soft, non-tender and no hernia; incision well-healed  Pelvis:  External genitalia: normal general appearance Urinary system: urethral meatus normal and bladder without fullness, nontender Vaginal: normal without tenderness, induration or masses Cervix: normal appearance Adnexa: normal bimanual exam Uterus: anteverted and non-tender, normal size    Assessment:     Normal postpartum exam.  Plan:    Contraception: plans condoms Follow up as needed.  Preconception counseling provided Healthy lifestyle practices reviewed

## 2014-10-27 ENCOUNTER — Telehealth: Payer: Self-pay | Admitting: *Deleted

## 2014-10-27 NOTE — Telephone Encounter (Signed)
Call to patient- let her know lab recommendation. Patient will call to schedule appointment.

## 2014-10-27 NOTE — Telephone Encounter (Signed)
-----   Message from Lahoma Crocker, MD sent at 10/17/2014  9:29 PM EST ----- Needs repeat Vitamin D level

## 2014-11-16 ENCOUNTER — Encounter: Payer: Self-pay | Admitting: *Deleted

## 2014-12-05 ENCOUNTER — Encounter: Payer: Self-pay | Admitting: *Deleted

## 2014-12-06 ENCOUNTER — Encounter: Payer: Self-pay | Admitting: Obstetrics & Gynecology

## 2014-12-12 IMAGING — US US OB FOLLOW-UP
1 series · 12 of 28 positions shown · non-contrast
Comparison: none

[Series 1: us ob follow up · 30 acquisitions, 12 frames shown]
[im 2/30]
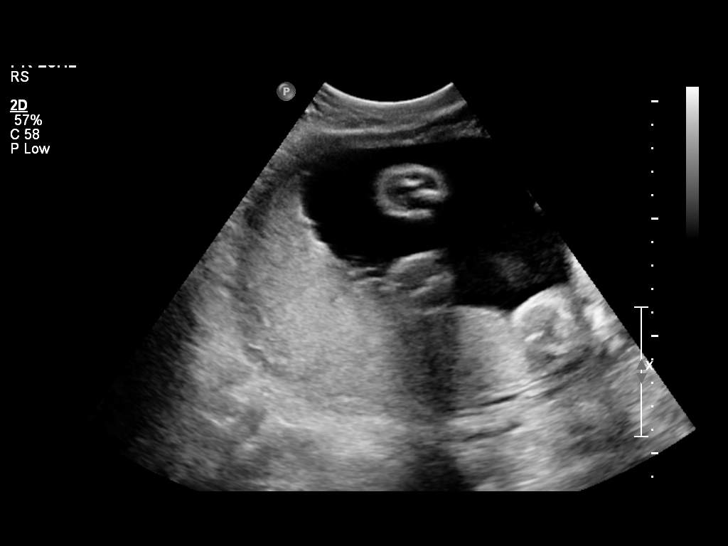
[im 4/30]
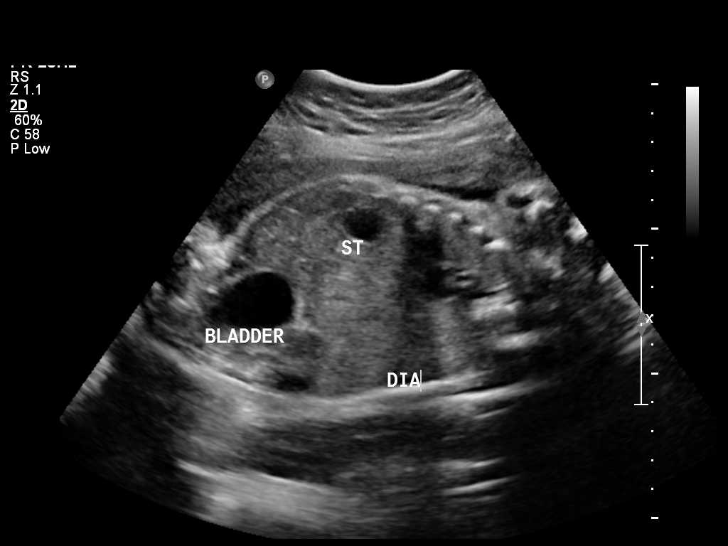
[im 6/30]
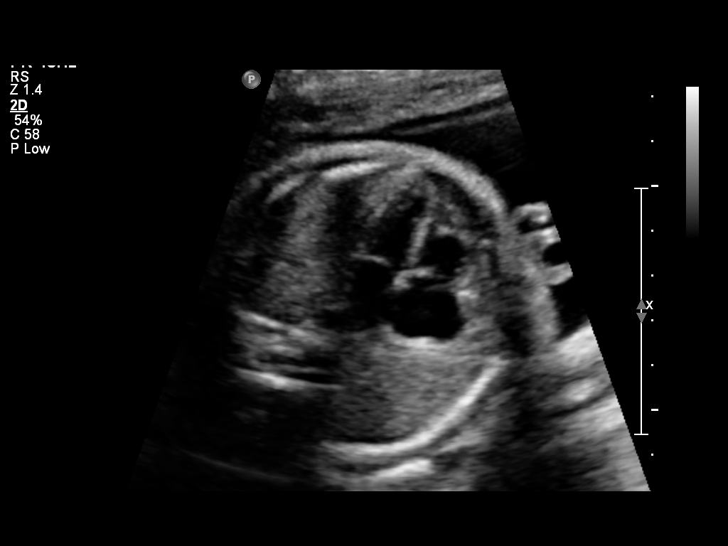
[im 9/30]
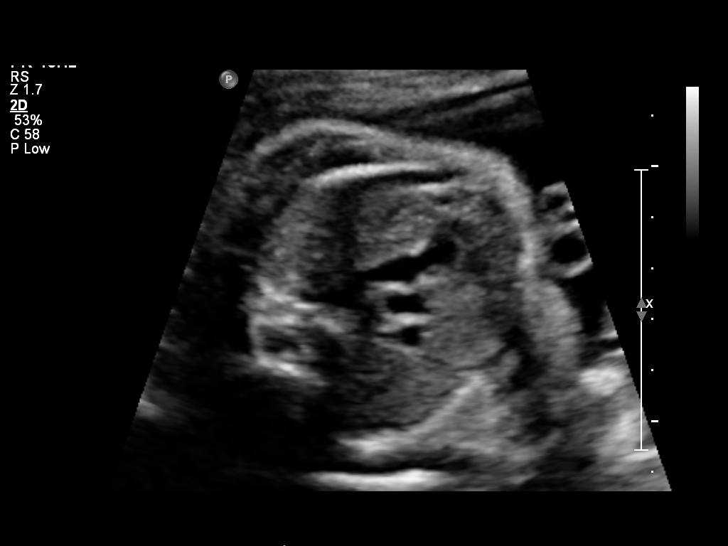
[im 11/30]
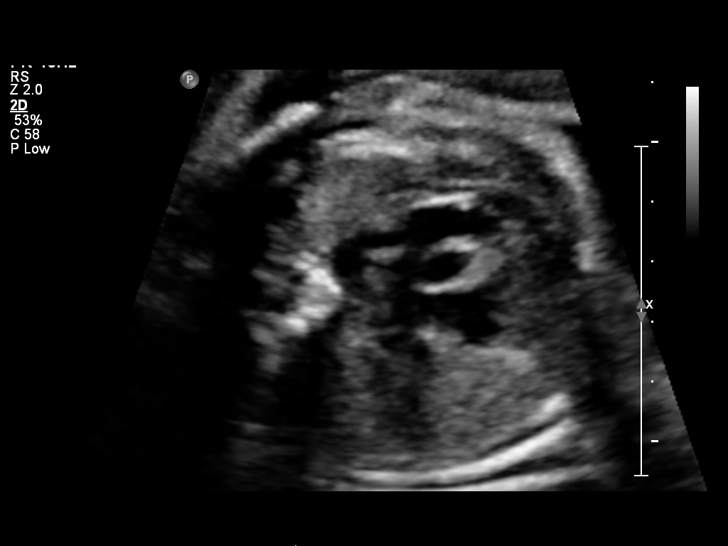
[im 13/30]
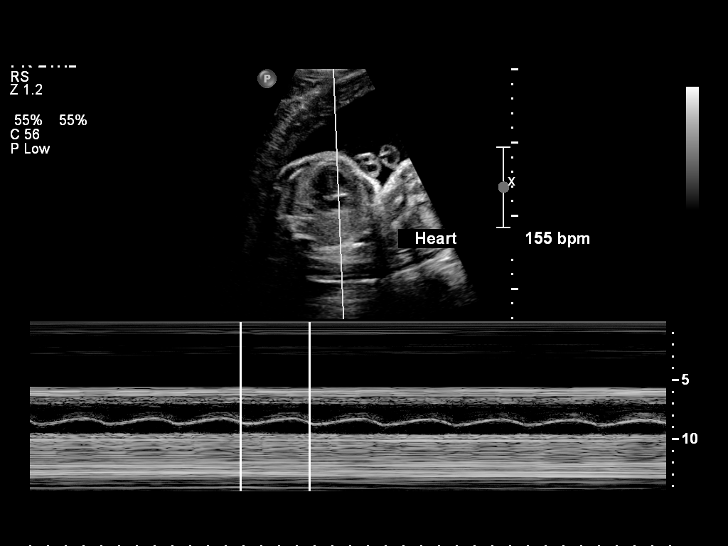
[im 17/30]
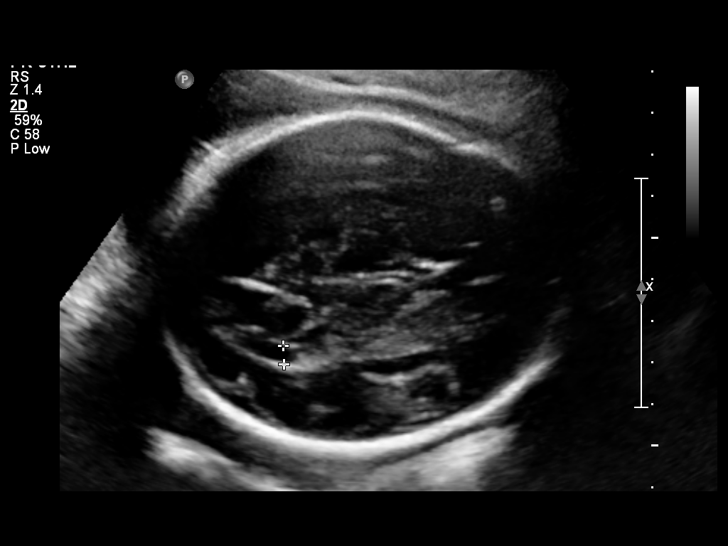
[im 19/30]
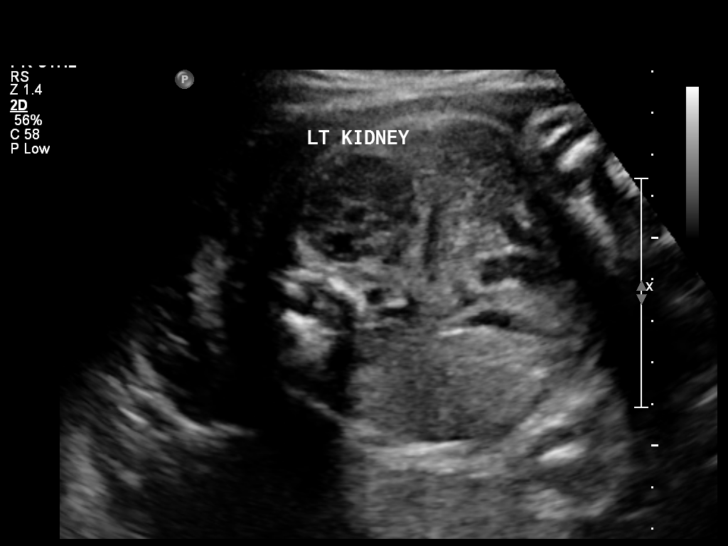
[im 21/30]
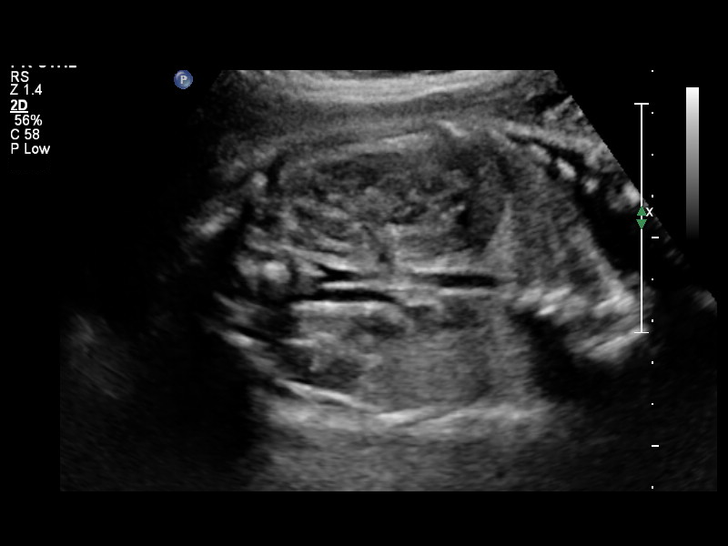
[im 24/30]
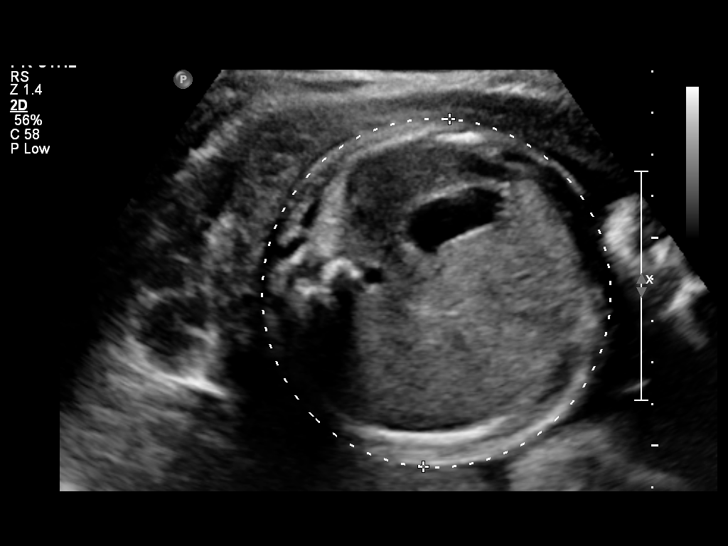
[im 26/30]
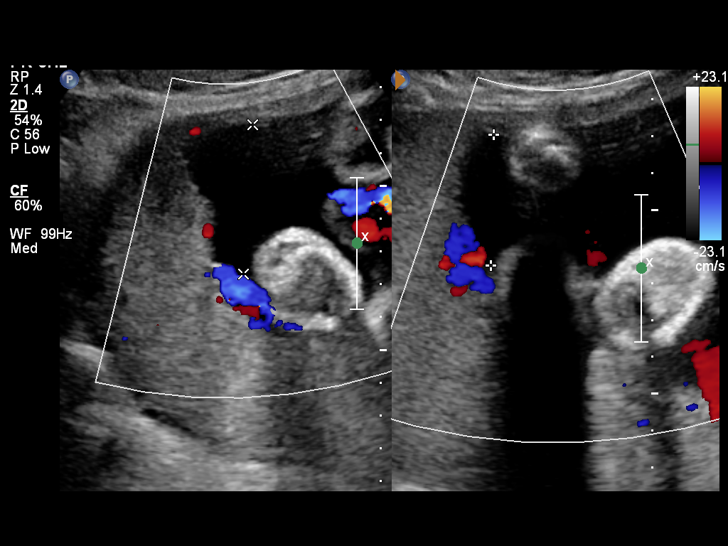
[im 28/30]
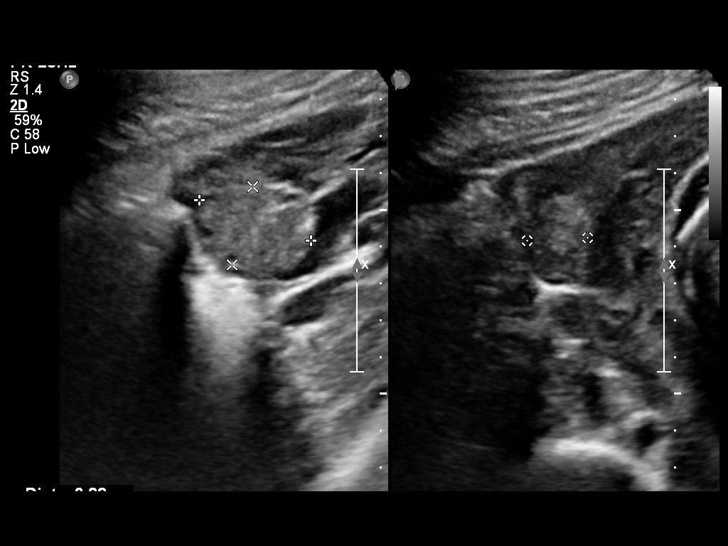

[12 of 28 positions shown; findings below may reference images not displayed]

OBSTETRICS REPORT
                      (Signed Final 07/06/2014 [DATE])

Service(s) Provided

 US OB FOLLOW UP                                       76816.1
Indications

 Fetal abnormality - other known or suspected
 (suspected unilateral right renal agenesis)
Fetal Evaluation

 Num Of Fetuses:    1
 Fetal Heart Rate:  155                          bpm
 Cardiac Activity:  Observed
 Presentation:      Cephalic
 Placenta:          Posterior, above cervical
                    os
 P. Cord            Previously Visualized
 Insertion:

 Amniotic Fluid
 AFI FV:      Subjectively within normal limits
 AFI Sum:     15.5    cm       55  %Tile     Larg Pckt:    4.54  cm
 RUQ:   4.54    cm   RLQ:    4.12   cm    LUQ:   3.55    cm   LLQ:    3.29   cm
Biometry

 BPD:       78  mm     G. Age:  31w 2d                CI:        75.95   70 - 86
                                                      FL/HC:      20.3   19.3 -

 HC:     283.7  mm     G. Age:  31w 1d       29  %    HC/AC:      1.08   0.96 -

 AC:     263.2  mm     G. Age:  30w 3d       43  %    FL/BPD:     73.8   71 - 87
 FL:      57.6  mm     G. Age:  30w 1d       25  %    FL/AC:      21.9   20 - 24
 HUM:     50.2  mm     G. Age:  29w 3d       28  %

 Est. FW:    9421  gm      3 lb 8 oz     53  %
Gestational Age

 LMP:           30w 4d        Date:  12/04/13                 EDD:   09/10/14
 U/S Today:     30w 5d                                        EDD:   09/09/14
 Best:          30w 4d     Det. By:  LMP  (12/04/13)          EDD:   09/10/14
Anatomy

 Cranium:          Appears normal         Aortic Arch:      Previously seen
 Fetal Cavum:      Previously seen        Ductal Arch:      Previously seen
 Ventricles:       Appears normal         Diaphragm:        Appears normal
 Choroid Plexus:   Appears normal         Stomach:          Appears normal
 Cerebellum:       Previously seen        Abdomen:          Appears normal
 Posterior Fossa:  Appears normal         Abdominal Wall:   Previously seen
 Nuchal Fold:      Not applicable (>20    Cord Vessels:     Previously seen
                   wks GA)
 Face:             Appears normal         Kidneys:          Absent right kidney
                   (orbits and profile)
 Lips:             Previously seen        Bladder:          Appears normal
 Heart:            Appears normal         Spine:            Previously seen
                   (4CH, axis, and
                   situs)
 RVOT:             Appears normal         Lower             Previously seen
                                          Extremities:
 LVOT:             Appears normal         Upper             Previously seen
                                          Extremities:

 Other:  Fetus appears to be a male. Heels and 5th digit visualized. Nasal
         bone visualized.
Targeted Anatomy

 Fetal Central Nervous System
 Lat. Ventricles:
Cervix Uterus Adnexa

 Cervical Length:    3.97     cm

 Cervix:       Normal appearance by transabdominal scan.

 Left Ovary:    Within normal limits.
 Right Ovary:   Within normal limits.
Impression

 Single IUP at 02w3d
 EFW 53rd%
 Absent right kidney - isolated finding
 The remainder of the fetal anatomy appears normal
 Posterior placenta without previa
 Normal amnioitic fluid volume
Recommendations

 Recommend follow-up ultrasound examination in 6 wks for
 growth.
 Will need imaging study of the newborn's kidneys after
 delivery - notify Peds at time of delivery

## 2014-12-14 ENCOUNTER — Ambulatory Visit: Payer: BC Managed Care – PPO | Admitting: Obstetrics & Gynecology

## 2017-11-03 ENCOUNTER — Other Ambulatory Visit (HOSPITAL_COMMUNITY)
Admission: RE | Admit: 2017-11-03 | Discharge: 2017-11-03 | Disposition: A | Discharge status: Home / Self Care | Payer: BLUE CROSS/BLUE SHIELD | Source: Ambulatory Visit | Attending: Obstetrics and Gynecology | Admitting: Obstetrics and Gynecology

## 2017-11-03 ENCOUNTER — Other Ambulatory Visit: Payer: Self-pay | Admitting: Obstetrics and Gynecology

## 2017-11-03 DIAGNOSIS — Z01411 Encounter for gynecological examination (general) (routine) with abnormal findings: Secondary | ICD-10-CM | POA: Diagnosis not present

## 2017-11-03 DIAGNOSIS — Z124 Encounter for screening for malignant neoplasm of cervix: Secondary | ICD-10-CM | POA: Insufficient documentation

## 2017-11-04 LAB — CYTOLOGY - PAP
Diagnosis: NEGATIVE
HPV (WINDOPATH): NOT DETECTED

## 2017-11-12 DIAGNOSIS — M2041 Other hammer toe(s) (acquired), right foot: Secondary | ICD-10-CM | POA: Diagnosis not present

## 2017-11-12 DIAGNOSIS — M2042 Other hammer toe(s) (acquired), left foot: Secondary | ICD-10-CM | POA: Diagnosis not present

## 2017-11-12 DIAGNOSIS — M79672 Pain in left foot: Secondary | ICD-10-CM | POA: Diagnosis not present

## 2017-12-15 DIAGNOSIS — N920 Excessive and frequent menstruation with regular cycle: Secondary | ICD-10-CM | POA: Diagnosis not present

## 2018-01-13 DIAGNOSIS — G4700 Insomnia, unspecified: Secondary | ICD-10-CM | POA: Diagnosis not present

## 2018-01-13 DIAGNOSIS — L309 Dermatitis, unspecified: Secondary | ICD-10-CM | POA: Diagnosis not present

## 2018-01-13 DIAGNOSIS — R0683 Snoring: Secondary | ICD-10-CM | POA: Diagnosis not present

## 2018-01-13 DIAGNOSIS — R400 Somnolence: Secondary | ICD-10-CM | POA: Diagnosis not present

## 2018-01-28 ENCOUNTER — Other Ambulatory Visit: Payer: Self-pay

## 2018-01-28 ENCOUNTER — Encounter (HOSPITAL_COMMUNITY): Payer: Self-pay | Admitting: *Deleted

## 2018-02-24 ENCOUNTER — Encounter (HOSPITAL_COMMUNITY): Payer: Self-pay | Admitting: Anesthesiology

## 2018-02-24 DIAGNOSIS — R5383 Other fatigue: Secondary | ICD-10-CM | POA: Diagnosis not present

## 2018-02-24 DIAGNOSIS — L661 Lichen planopilaris: Secondary | ICD-10-CM | POA: Diagnosis not present

## 2018-02-24 DIAGNOSIS — R635 Abnormal weight gain: Secondary | ICD-10-CM | POA: Diagnosis not present

## 2018-02-24 NOTE — Anesthesia Preprocedure Evaluation (Deleted)
Anesthesia Evaluation  Patient identified by MRN, date of birth, ID band Patient awake    Reviewed: Allergy & Precautions, H&P , NPO status , Patient's Chart, lab work & pertinent test results  Airway Mallampati: I  TM Distance: >3 FB Neck ROM: full    Dental no notable dental hx.    Pulmonary neg pulmonary ROS,    Pulmonary exam normal breath sounds clear to auscultation       Cardiovascular negative cardio ROS Normal cardiovascular exam Rhythm:regular Rate:Normal     Neuro/Psych negative neurological ROS  negative psych ROS   GI/Hepatic negative GI ROS, Neg liver ROS,   Endo/Other  negative endocrine ROS  Renal/GU negative Renal ROS  negative genitourinary   Musculoskeletal negative musculoskeletal ROS (+)   Abdominal   Peds negative pediatric ROS (+)  Hematology  (+) anemia ,   Anesthesia Other Findings   Reproductive/Obstetrics negative OB ROS                             Anesthesia Physical  Anesthesia Plan  ASA: II  Anesthesia Plan: General   Post-op Pain Management:    Induction: Intravenous  PONV Risk Score and Plan: 3 and Treatment may vary due to age or medical condition, Ondansetron, Dexamethasone and Scopolamine patch - Pre-op  Airway Management Planned: LMA  Additional Equipment:   Intra-op Plan:   Post-operative Plan:   Informed Consent: I have reviewed the patients History and Physical, chart, labs and discussed the procedure including the risks, benefits and alternatives for the proposed anesthesia with the patient or authorized representative who has indicated his/her understanding and acceptance.     Plan Discussed with: Surgeon, CRNA and Anesthesiologist  Anesthesia Plan Comments: ( )        Anesthesia Quick Evaluation

## 2018-02-25 ENCOUNTER — Encounter (HOSPITAL_COMMUNITY): Admission: RE | Payer: Self-pay | Source: Ambulatory Visit

## 2018-02-25 ENCOUNTER — Ambulatory Visit (HOSPITAL_COMMUNITY)
Admission: RE | Admit: 2018-02-25 | Payer: BLUE CROSS/BLUE SHIELD | Source: Ambulatory Visit | Admitting: Obstetrics and Gynecology

## 2018-02-25 HISTORY — DX: Family history of other specified conditions: Z84.89

## 2018-02-25 SURGERY — DILATATION & CURETTAGE/HYSTEROSCOPY WITH MYOSURE
Anesthesia: Choice

## 2018-02-25 MED ORDER — FENTANYL CITRATE (PF) 100 MCG/2ML IJ SOLN
INTRAMUSCULAR | Status: AC
  Filled 2018-02-25: qty 2

## 2018-03-06 ENCOUNTER — Encounter: Payer: Self-pay | Admitting: Pulmonary Disease

## 2018-03-06 ENCOUNTER — Ambulatory Visit (INDEPENDENT_AMBULATORY_CARE_PROVIDER_SITE_OTHER): Payer: BLUE CROSS/BLUE SHIELD | Admitting: Pulmonary Disease

## 2018-03-06 VITALS — BP 106/72 | HR 78 | Ht 65.0 in | Wt 162.4 lb

## 2018-03-06 DIAGNOSIS — R29818 Other symptoms and signs involving the nervous system: Secondary | ICD-10-CM | POA: Diagnosis not present

## 2018-03-06 NOTE — Progress Notes (Signed)
[name]  Chief Complaint  Patient presents with  . New Consult    has been taking OTC sleep medication for 3 years, poor quality of sleep     Vital signs: BP 106/72 (BP Location: Left Arm, Cuff Size: Normal)   Pulse 78   Ht 5\' 5"  (1.651 m)   Wt 162 lb 6.4 oz (73.7 kg)   SpO2 99%   BMI 27.02 kg/m   History of Present Illness: Stephanie Marshall is a 37 y.o. female for evaluation of sleep problems.  She had a baby a few years ago.  Her son was a terrible sleeper and didn't start sleeping through the night until he was 57 months old.  Since then she hasn't been able to fall asleep or stay asleep w/o using a sleep aide.  She has been using Zquil for about the last 3 yrs.  She takes this at 10 pm.  She goes to bed at 11 pm.  She falls asleep at 1130 pm. She tends to sleep through the night better if she uses Zquil.  She sometimes wakes up to use the bathroom.  She gets out of bed at 8 am.  She feels tired in the morning.  She would take naps if her scheduled allowed.  She tries to eat later in the day to avoid feeling a lull after eating.  She isn't using anything to help stay awake.    She snores.  She isn't sure if she stops breathing while asleep.  Her husband kicks around a lot when he is dreaming.  Her father has sleep apnea.  She denies sleep walking, sleep talking, bruxism, or nightmares.  There is no history of restless legs.  She denies sleep hallucinations, sleep paralysis, or cataplexy.  The Epworth score is 7 out of 24.   Physical Exam:  General - pleasant Eyes - pupils reactive ENT - no sinus tenderness, no oral exudate, no LAN, MP 4, triangular uvula, scalloped tongue Cardiac - regular, no murmur Chest - no wheeze, rales Abd - soft, non tender Ext - no edema Skin - no rashes Neuro - normal strength Psych - normal mood  Discussion: She has snoring, sleep disruption, and daytime sleepiness.  She has family history of sleep  apnea, and upper airway anatomy suggestive of risk for sleep apnea.  She also has symptoms of sleep onset and maintenance insomnia that coincide with her son's sleep issues after birth, but her insomnia has persisted.  Assessment/Plan:  Suspected sleep apnea. - will arrange for home sleep study  Insomnia. - continue zquil for now - if sleep study unrevealing, then will focus on CBT and consider alternative sleep aide - discussed proper sleep hygiene   Patient Instructions  Will arrange for home sleep study Will call to arrange for follow up after sleep study reviewed    Chesley Mires, MD [place] 03/06/2018, 10:52 AM Pager:  [phone]  Flow Sheet  Sleep tests:  Review of Systems: Constitutional: Negative for fever and unexpected weight change.  HENT: Positive for sneezing. Negative for congestion, dental problem, ear pain, nosebleeds, postnasal drip, rhinorrhea, sinus pressure, sore throat and trouble swallowing.   Eyes: Negative for redness and itching.  Respiratory: Negative for cough, chest tightness, shortness of breath and wheezing.   Cardiovascular: Negative for palpitations and leg swelling.  Gastrointestinal: Negative for nausea and vomiting.  Genitourinary: Negative for dysuria.  Musculoskeletal: Negative for joint swelling.  Skin: Positive for rash.  Eczema   Allergic/Immunologic: Positive for environmental allergies. Negative for food allergies and immunocompromised state.  Neurological: Negative for headaches.  Hematological: Does not bruise/bleed easily.  Psychiatric/Behavioral: Negative for dysphoric mood. The patient is not nervous/anxious.    Past Medical History: She  has a past medical history of Allergy, Anemia, Dysmenorrhea, Eczema, Family history of adverse reaction to anesthesia, and Vitamin D deficiency.  Past Surgical History: She  has a past surgical history that includes Polypectomy (12/2009); ORIF ankle fracture  (2005); Bunionectomy (Bilateral); Cesarean section (N/A, 09/12/2014); and Wisdom tooth extraction (2009).  Family History: Her family history includes Alcohol abuse in her maternal grandfather; Cancer in her mother; Diabetes in her paternal grandfather; Hyperlipidemia in her father; Hypertension in her father and mother.  Social History: She  reports that she has never smoked. She has never used smokeless tobacco. She reports that she does not drink alcohol or use drugs.  Medications: Allergies as of 03/06/2018      Reactions   Erythromycin Nausea Only, Other (See Comments)   Fainting       Medication List        Accurate as of 03/06/18 10:52 AM. Always use your most recent med list.          diphenhydrAMINE 50 MG capsule Commonly known as:  BENADRYL Take 50 mg by mouth every 6 (six) hours as needed for allergies.   ibuprofen 600 MG tablet Commonly known as:  ADVIL,MOTRIN Take 1 tablet (600 mg total) by mouth every 6 (six) hours as needed.   ZZZQUIL 50 MG/30ML Liqd Generic drug:  diphenhydrAMINE HCl Take 50 mg by mouth at bedtime.

## 2018-03-06 NOTE — Progress Notes (Signed)
   Subjective:    Patient ID: Stephanie Marshall, female    DOB: 1981/06/06, 37 y.o.   MRN: [id number]  HPI    Review of Systems  Constitutional: Negative for fever and unexpected weight change.  HENT: Positive for sneezing. Negative for congestion, dental problem, ear pain, nosebleeds, postnasal drip, rhinorrhea, sinus pressure, sore throat and trouble swallowing.   Eyes: Negative for redness and itching.  Respiratory: Negative for cough, chest tightness, shortness of breath and wheezing.   Cardiovascular: Negative for palpitations and leg swelling.  Gastrointestinal: Negative for nausea and vomiting.  Genitourinary: Negative for dysuria.  Musculoskeletal: Negative for joint swelling.  Skin: Positive for rash.       Eczema   Allergic/Immunologic: Positive for environmental allergies. Negative for food allergies and immunocompromised state.  Neurological: Negative for headaches.  Hematological: Does not bruise/bleed easily.  Psychiatric/Behavioral: Negative for dysphoric mood. The patient is not nervous/anxious.        Objective:   Physical Exam        Assessment & Plan:

## 2018-03-06 NOTE — Patient Instructions (Signed)
Will arrange for home sleep study Will call to arrange for follow up after sleep study reviewed  

## 2018-04-01 DIAGNOSIS — G4733 Obstructive sleep apnea (adult) (pediatric): Secondary | ICD-10-CM | POA: Diagnosis not present

## 2018-04-01 DIAGNOSIS — Z01818 Encounter for other preprocedural examination: Secondary | ICD-10-CM | POA: Diagnosis not present

## 2018-04-01 DIAGNOSIS — N9489 Other specified conditions associated with female genital organs and menstrual cycle: Secondary | ICD-10-CM | POA: Diagnosis not present

## 2018-04-01 DIAGNOSIS — N920 Excessive and frequent menstruation with regular cycle: Secondary | ICD-10-CM | POA: Diagnosis not present

## 2018-04-03 ENCOUNTER — Encounter: Payer: Self-pay | Admitting: Pulmonary Disease

## 2018-04-03 ENCOUNTER — Telehealth: Payer: Self-pay | Admitting: Pulmonary Disease

## 2018-04-03 DIAGNOSIS — G4733 Obstructive sleep apnea (adult) (pediatric): Secondary | ICD-10-CM

## 2018-04-03 HISTORY — DX: Obstructive sleep apnea (adult) (pediatric): G47.33

## 2018-04-03 NOTE — Telephone Encounter (Signed)
HST 04/01/18 >> AHI 5.3, SaO2 low 80%  Will have my nurse inform pt that sleep study shows mild sleep apnea.  Please schedule ROV with me to discuss tx options.

## 2018-04-06 ENCOUNTER — Other Ambulatory Visit: Payer: Self-pay | Admitting: *Deleted

## 2018-04-06 DIAGNOSIS — R29818 Other symptoms and signs involving the nervous system: Secondary | ICD-10-CM

## 2018-04-06 NOTE — Telephone Encounter (Signed)
Was able to talk to patient regarding patient's results.  They verbalized an understanding of what was discussed.  We were able to schedule ROV patient for 0945 Wed 5.1.19 with VS.  No further questions at this time.

## 2018-04-08 DIAGNOSIS — N920 Excessive and frequent menstruation with regular cycle: Secondary | ICD-10-CM

## 2018-04-08 DIAGNOSIS — N9489 Other specified conditions associated with female genital organs and menstrual cycle: Secondary | ICD-10-CM

## 2018-04-08 HISTORY — DX: Other specified conditions associated with female genital organs and menstrual cycle: N94.89

## 2018-04-08 HISTORY — DX: Excessive and frequent menstruation with regular cycle: N92.0

## 2018-04-10 ENCOUNTER — Ambulatory Visit: Payer: BLUE CROSS/BLUE SHIELD | Admitting: Pulmonary Disease

## 2018-04-10 ENCOUNTER — Encounter: Payer: Self-pay | Admitting: Pulmonary Disease

## 2018-04-10 VITALS — BP 104/68 | HR 87 | Ht 64.0 in | Wt 159.8 lb

## 2018-04-10 DIAGNOSIS — G4733 Obstructive sleep apnea (adult) (pediatric): Secondary | ICD-10-CM | POA: Diagnosis not present

## 2018-04-10 NOTE — Patient Instructions (Signed)
Discuss with your dentist about getting an oral appliance to treat obstructive sleep apnea  Follow up in 6 months

## 2018-04-10 NOTE — Progress Notes (Signed)
[facility]  Chief Complaint  Patient presents with  . Follow-up    sleep study results, mild apnea    Vital signs: BP 104/68 (BP Location: Left Arm, Cuff Size: Normal)   Pulse 87   Ht 5\' 4"  (1.626 m)   Wt 159 lb 12.8 oz (72.5 kg)   SpO2 100%   BMI 27.43 kg/m   History of Present Illness: Stephanie Marshall is a 37 y.o. female with obstructive sleep apnea.  She is here to review her sleep study.  This shows mild sleep apnea.  Physical Exam:  General - pleasant Eyes - pupils reactive ENT - no sinus tenderness, no oral exudate, no LAN, MP 4, triangular uvula, scalloped tongue Cardiac - regular, no murmur Chest - no wheeze, rales Abd - soft, non tender Ext - no edema Skin - no rashes Neuro - normal strength Psych - normal mood   Assessment/Plan:  Obstructive sleep apnea. - We discussed how sleep apnea can affect various health problems, including risks for hypertension, cardiovascular disease, and diabetes.  We also discussed how sleep disruption can increase risks for accidents, such as while driving.  Weight loss as a means of improving sleep apnea was also reviewed.  Additional treatment options discussed were CPAP therapy, oral appliance, and surgical intervention. - she would like to try oral appliance first   Patient Instructions  Discuss with your dentist about getting an oral appliance to treat obstructive sleep apnea  Follow up in 6 months   Chesley Mires, MD [place] 04/10/2018, 10:03 AM Pager:  [phone]  Flow Sheet  Sleep tests: HST 04/01/18 >> AHI 5.3, SaO2 low 80%  Past Medical History: She  has a past medical history of Allergy, Anemia, Dysmenorrhea, Eczema, Family history of adverse reaction to anesthesia, OSA (obstructive sleep apnea) (04/03/2018), and Vitamin D deficiency.  Past Surgical History: She  has a past surgical history that includes Polypectomy (12/2009); ORIF ankle  fracture (2005); Bunionectomy (Bilateral); Cesarean section (N/A, 09/12/2014); and Wisdom tooth extraction (2009).  Family History: Her family history includes Alcohol abuse in her maternal grandfather; Cancer in her mother; Diabetes in her paternal grandfather; Hyperlipidemia in her father; Hypertension in her father and mother.  Social History: She  reports that she has never smoked. She has never used smokeless tobacco. She reports that she does not drink alcohol or use drugs.  Medications: Allergies as of 04/10/2018      Reactions   Erythromycin Nausea Only, Other (See Comments)   Fainting       Medication List        Accurate as of 04/10/18 10:03 AM. Always use your most recent med list.          diphenhydrAMINE 50 MG capsule Commonly known as:  BENADRYL Take 50 mg by mouth every 6 (six) hours as needed for allergies.   ibuprofen 600 MG tablet Commonly known as:  ADVIL,MOTRIN Take 1 tablet (600 mg total) by mouth every 6 (six) hours as needed.   ZZZQUIL 50 MG/30ML Liqd Generic drug:  diphenhydrAMINE HCl Take 50 mg by mouth at bedtime.

## 2018-04-16 ENCOUNTER — Encounter (HOSPITAL_BASED_OUTPATIENT_CLINIC_OR_DEPARTMENT_OTHER): Payer: Self-pay | Admitting: *Deleted

## 2018-04-16 ENCOUNTER — Other Ambulatory Visit: Payer: Self-pay

## 2018-04-16 NOTE — Pre-Procedure Instructions (Signed)
To come for CBC and urine preg. 

## 2018-04-20 ENCOUNTER — Encounter (HOSPITAL_BASED_OUTPATIENT_CLINIC_OR_DEPARTMENT_OTHER)
Admission: RE | Admit: 2018-04-20 | Discharge: 2018-04-20 | Disposition: A | Discharge status: Home / Self Care | Payer: BLUE CROSS/BLUE SHIELD | Source: Ambulatory Visit | Attending: Obstetrics and Gynecology | Admitting: Obstetrics and Gynecology

## 2018-04-20 DIAGNOSIS — N9489 Other specified conditions associated with female genital organs and menstrual cycle: Secondary | ICD-10-CM | POA: Diagnosis not present

## 2018-04-20 DIAGNOSIS — Z01812 Encounter for preprocedural laboratory examination: Secondary | ICD-10-CM | POA: Insufficient documentation

## 2018-04-20 DIAGNOSIS — G4730 Sleep apnea, unspecified: Secondary | ICD-10-CM | POA: Diagnosis not present

## 2018-04-20 DIAGNOSIS — N920 Excessive and frequent menstruation with regular cycle: Secondary | ICD-10-CM | POA: Diagnosis not present

## 2018-04-20 DIAGNOSIS — D649 Anemia, unspecified: Secondary | ICD-10-CM | POA: Diagnosis not present

## 2018-04-20 DIAGNOSIS — D259 Leiomyoma of uterus, unspecified: Secondary | ICD-10-CM | POA: Diagnosis not present

## 2018-04-20 DIAGNOSIS — N858 Other specified noninflammatory disorders of uterus: Secondary | ICD-10-CM | POA: Diagnosis not present

## 2018-04-20 LAB — CBC
HEMATOCRIT: 35.1 % — AB (ref 36.0–46.0)
Hemoglobin: 11.2 g/dL — ABNORMAL LOW (ref 12.0–15.0)
MCH: 26.5 pg (ref 26.0–34.0)
MCHC: 31.9 g/dL (ref 30.0–36.0)
MCV: 83.2 fL (ref 78.0–100.0)
PLATELETS: 393 10*3/uL (ref 150–400)
RBC: 4.22 MIL/uL (ref 3.87–5.11)
RDW: 14.2 % (ref 11.5–15.5)
WBC: 5.8 10*3/uL (ref 4.0–10.5)

## 2018-04-20 NOTE — Pre-Procedure Instructions (Signed)
Urine Pregnancy not run due to pt stated she started period on Friday - per D. Moehring Therapist, sports.

## 2018-04-22 ENCOUNTER — Ambulatory Visit (HOSPITAL_BASED_OUTPATIENT_CLINIC_OR_DEPARTMENT_OTHER): Payer: BLUE CROSS/BLUE SHIELD | Admitting: Certified Registered"

## 2018-04-22 ENCOUNTER — Other Ambulatory Visit: Payer: Self-pay

## 2018-04-22 ENCOUNTER — Encounter (HOSPITAL_BASED_OUTPATIENT_CLINIC_OR_DEPARTMENT_OTHER)
Admission: RE | Disposition: A | Discharge status: Home / Self Care | Payer: Self-pay | Source: Ambulatory Visit | Attending: Obstetrics and Gynecology

## 2018-04-22 ENCOUNTER — Encounter (HOSPITAL_BASED_OUTPATIENT_CLINIC_OR_DEPARTMENT_OTHER): Payer: Self-pay | Admitting: Certified Registered"

## 2018-04-22 ENCOUNTER — Ambulatory Visit (HOSPITAL_BASED_OUTPATIENT_CLINIC_OR_DEPARTMENT_OTHER)
Admission: RE | Admit: 2018-04-22 | Discharge: 2018-04-22 | Disposition: A | Discharge status: Home / Self Care | Payer: BLUE CROSS/BLUE SHIELD | Source: Ambulatory Visit | Attending: Obstetrics and Gynecology | Admitting: Obstetrics and Gynecology

## 2018-04-22 DIAGNOSIS — N9489 Other specified conditions associated with female genital organs and menstrual cycle: Secondary | ICD-10-CM | POA: Diagnosis not present

## 2018-04-22 DIAGNOSIS — G4730 Sleep apnea, unspecified: Secondary | ICD-10-CM | POA: Insufficient documentation

## 2018-04-22 DIAGNOSIS — D649 Anemia, unspecified: Secondary | ICD-10-CM | POA: Insufficient documentation

## 2018-04-22 DIAGNOSIS — D259 Leiomyoma of uterus, unspecified: Secondary | ICD-10-CM | POA: Insufficient documentation

## 2018-04-22 DIAGNOSIS — N858 Other specified noninflammatory disorders of uterus: Secondary | ICD-10-CM | POA: Diagnosis not present

## 2018-04-22 DIAGNOSIS — N920 Excessive and frequent menstruation with regular cycle: Secondary | ICD-10-CM | POA: Diagnosis not present

## 2018-04-22 HISTORY — PX: MYOMECTOMY: SHX85

## 2018-04-22 HISTORY — DX: Lichen planopilaris: L66.1

## 2018-04-22 HISTORY — DX: Frontal fibrosing alopecia: L66.12

## 2018-04-22 HISTORY — DX: Other specified conditions associated with female genital organs and menstrual cycle: N94.89

## 2018-04-22 HISTORY — PX: DILATATION & CURETTAGE/HYSTEROSCOPY WITH MYOSURE: SHX6511

## 2018-04-22 HISTORY — DX: Excessive and frequent menstruation with regular cycle: N92.0

## 2018-04-22 LAB — POCT PREGNANCY, URINE: Preg Test, Ur: NEGATIVE

## 2018-04-22 SURGERY — DILATATION & CURETTAGE/HYSTEROSCOPY WITH MYOSURE
Anesthesia: General | Site: Uterus

## 2018-04-22 MED ORDER — PROMETHAZINE HCL 25 MG/ML IJ SOLN
6.2500 mg | INTRAMUSCULAR | Status: DC | PRN
  Administered 2018-04-22: 6.25 mg via INTRAVENOUS

## 2018-04-22 MED ORDER — SCOPOLAMINE 1 MG/3DAYS TD PT72: 1.0000 | MEDICATED_PATCH | Freq: Once | TRANSDERMAL | Status: DC

## 2018-04-22 MED ORDER — HYDROMORPHONE HCL 1 MG/ML IJ SOLN
0.2500 mg | INTRAMUSCULAR | Status: DC | PRN
  Administered 2018-04-22: 0.5 mg via INTRAVENOUS

## 2018-04-22 MED ORDER — SILVER NITRATE-POT NITRATE 75-25 % EX MISC
CUTANEOUS | Status: DC | PRN
  Administered 2018-04-22: 1

## 2018-04-22 MED ORDER — OXYCODONE HCL 5 MG/5ML PO SOLN: 5.0000 mg | Freq: Once | ORAL | Status: DC | PRN

## 2018-04-22 MED ORDER — IBUPROFEN 600 MG PO TABS: 600.0000 mg | ORAL_TABLET | Freq: Four times a day (QID) | ORAL | 0 refills | Status: AC | PRN

## 2018-04-22 MED ORDER — FENTANYL CITRATE (PF) 100 MCG/2ML IJ SOLN
INTRAMUSCULAR | Status: DC | PRN
  Administered 2018-04-22 (×2): 50 ug via INTRAVENOUS

## 2018-04-22 MED ORDER — SCOPOLAMINE 1 MG/3DAYS TD PT72: 1.0000 | MEDICATED_PATCH | Freq: Once | TRANSDERMAL | Status: DC | PRN

## 2018-04-22 MED ORDER — PROPOFOL 10 MG/ML IV BOLUS
INTRAVENOUS | Status: AC
  Filled 2018-04-22: qty 20

## 2018-04-22 MED ORDER — VASOPRESSIN 20 UNIT/ML IV SOLN
INTRAVENOUS | Status: DC | PRN
  Administered 2018-04-22: 11 mL via INTRAMUSCULAR

## 2018-04-22 MED ORDER — OXYCODONE HCL 5 MG PO TABS: 5.0000 mg | ORAL_TABLET | Freq: Once | ORAL | Status: DC | PRN

## 2018-04-22 MED ORDER — HYDROMORPHONE HCL 1 MG/ML IJ SOLN
INTRAMUSCULAR | Status: AC
  Filled 2018-04-22: qty 0.5

## 2018-04-22 MED ORDER — LACTATED RINGERS IV SOLN: INTRAVENOUS | Status: DC

## 2018-04-22 MED ORDER — VASOPRESSIN 20 UNIT/ML IV SOLN
INTRAVENOUS | Status: AC
  Filled 2018-04-22: qty 1

## 2018-04-22 MED ORDER — MIDAZOLAM HCL 5 MG/5ML IJ SOLN
INTRAMUSCULAR | Status: DC | PRN
  Administered 2018-04-22: 2 mg via INTRAVENOUS

## 2018-04-22 MED ORDER — LACTATED RINGERS IV SOLN
INTRAVENOUS | Status: DC
  Administered 2018-04-22: 13:00:00 via INTRAVENOUS
  Administered 2018-04-22: 10 mL/h via INTRAVENOUS

## 2018-04-22 MED ORDER — FENTANYL CITRATE (PF) 100 MCG/2ML IJ SOLN
INTRAMUSCULAR | Status: AC
  Filled 2018-04-22: qty 2

## 2018-04-22 MED ORDER — LIDOCAINE HCL (CARDIAC) PF 100 MG/5ML IV SOSY
PREFILLED_SYRINGE | INTRAVENOUS | Status: AC
  Filled 2018-04-22: qty 5

## 2018-04-22 MED ORDER — OXYCODONE-ACETAMINOPHEN 5-325 MG PO TABS: 1.0000 | ORAL_TABLET | ORAL | 0 refills | Status: AC | PRN

## 2018-04-22 MED ORDER — SILVER NITRATE-POT NITRATE 75-25 % EX MISC
CUTANEOUS | Status: AC
  Filled 2018-04-22: qty 1

## 2018-04-22 MED ORDER — LIDOCAINE HCL 2 % IJ SOLN
INTRAMUSCULAR | Status: AC
  Filled 2018-04-22: qty 20

## 2018-04-22 MED ORDER — LIDOCAINE 2% (20 MG/ML) 5 ML SYRINGE
INTRAMUSCULAR | Status: DC | PRN
  Administered 2018-04-22: 100 mg via INTRAVENOUS

## 2018-04-22 MED ORDER — LIDOCAINE HCL 2 % IJ SOLN
INTRAMUSCULAR | Status: DC | PRN
  Administered 2018-04-22: 10 mL

## 2018-04-22 MED ORDER — FENTANYL CITRATE (PF) 100 MCG/2ML IJ SOLN: 50.0000 ug | INTRAMUSCULAR | Status: DC | PRN

## 2018-04-22 MED ORDER — KETOROLAC TROMETHAMINE 30 MG/ML IJ SOLN
INTRAMUSCULAR | Status: DC | PRN
  Administered 2018-04-22: 30 mg via INTRAVENOUS

## 2018-04-22 MED ORDER — SODIUM CHLORIDE 0.9 % IJ SOLN
INTRAMUSCULAR | Status: AC
  Filled 2018-04-22: qty 10

## 2018-04-22 MED ORDER — DEXAMETHASONE SODIUM PHOSPHATE 10 MG/ML IJ SOLN
INTRAMUSCULAR | Status: DC | PRN
  Administered 2018-04-22: 10 mg via INTRAVENOUS

## 2018-04-22 MED ORDER — PROMETHAZINE HCL 25 MG/ML IJ SOLN
INTRAMUSCULAR | Status: AC
  Filled 2018-04-22: qty 1

## 2018-04-22 MED ORDER — ACETAMINOPHEN 10 MG/ML IV SOLN
INTRAVENOUS | Status: AC
  Filled 2018-04-22: qty 100

## 2018-04-22 MED ORDER — SODIUM CHLORIDE 0.9 % IR SOLN
Status: DC | PRN
  Administered 2018-04-22: 4000 mL

## 2018-04-22 MED ORDER — MIDAZOLAM HCL 2 MG/2ML IJ SOLN: 1.0000 mg | INTRAMUSCULAR | Status: DC | PRN

## 2018-04-22 MED ORDER — ACETAMINOPHEN 10 MG/ML IV SOLN
1000.0000 mg | Freq: Once | INTRAVENOUS | Status: AC
  Administered 2018-04-22: 1000 mg via INTRAVENOUS

## 2018-04-22 MED ORDER — ONDANSETRON HCL 4 MG/2ML IJ SOLN
INTRAMUSCULAR | Status: DC | PRN
  Administered 2018-04-22 (×2): 4 mg via INTRAVENOUS

## 2018-04-22 MED ORDER — PROPOFOL 10 MG/ML IV BOLUS
INTRAVENOUS | Status: DC | PRN
  Administered 2018-04-22: 200 mg via INTRAVENOUS

## 2018-04-22 MED ORDER — MIDAZOLAM HCL 2 MG/2ML IJ SOLN
INTRAMUSCULAR | Status: AC
  Filled 2018-04-22: qty 2

## 2018-04-22 SURGICAL SUPPLY — 18 items
CANISTER AQUILEX FLUID KIT (CANNISTER) ×4 IMPLANT
CATH ROBINSON RED A/P 16FR (CATHETERS) IMPLANT
CONTAINER PREFILL 10% NBF 60ML (FORM) ×4 IMPLANT
DEVICE MYOSURE LITE (MISCELLANEOUS) IMPLANT
DEVICE MYOSURE REACH (MISCELLANEOUS) ×2 IMPLANT
DILATOR CANAL MILEX (MISCELLANEOUS) IMPLANT
FILTER ARTHROSCOPY CONVERTOR (FILTER) ×2 IMPLANT
GLOVE BIO SURGEON STRL SZ7 (GLOVE) ×2 IMPLANT
GLOVE BIOGEL PI IND STRL 7.0 (GLOVE) ×3 IMPLANT
GLOVE BIOGEL PI INDICATOR 7.0 (GLOVE) ×3
GLOVE SURG SS PI 7.0 STRL IVOR (GLOVE) ×2 IMPLANT
GOWN STRL REUS W/TWL LRG LVL3 (GOWN DISPOSABLE) ×2 IMPLANT
PACK VAGINAL MINOR WOMEN LF (CUSTOM PROCEDURE TRAY) ×2 IMPLANT
PAD OB MATERNITY 4.3X12.25 (PERSONAL CARE ITEMS) ×2 IMPLANT
SEAL ROD LENS SCOPE MYOSURE (ABLATOR) ×2 IMPLANT
TOWEL OR 17X24 6PK STRL BLUE (TOWEL DISPOSABLE) ×4 IMPLANT
TUBING AQUILEX INFLOW (TUBING) ×2 IMPLANT
TUBING AQUILEX OUTFLOW (TUBING) ×2 IMPLANT

## 2018-04-22 NOTE — Transfer of Care (Signed)
Immediate Anesthesia Transfer of Care Note  Patient: Stephanie Marshall  Procedure(s) Performed: DILATATION & CURETTAGE/HYSTEROSCOPY WITH MYOSURE (N/A Uterus) MYOMECTOMY, Hysteroscopy (N/A Uterus)  Patient Location: PACU  Anesthesia Type:General  Level of Consciousness: drowsy  Airway & Oxygen Therapy: Patient Spontanous Breathing and Patient connected to face mask oxygen  Post-op Assessment: Report given to RN and Post -op Vital signs reviewed and stable  Post vital signs: Reviewed and stable  Last Vitals:  Vitals Value Taken Time  BP 123/87 04/22/2018  1:54 PM  Temp    Pulse 76 04/22/2018  1:56 PM  Resp 15 04/22/2018  1:56 PM  SpO2 100 % 04/22/2018  1:56 PM    Last Pain:  Vitals:   04/22/18 1154  TempSrc: Oral  PainSc: 0-No pain         Complications: No apparent anesthesia complications

## 2018-04-22 NOTE — Anesthesia Preprocedure Evaluation (Addendum)
Anesthesia Evaluation  Patient identified by MRN, date of birth, ID band Patient awake    Reviewed: Allergy & Precautions, NPO status , Patient's Chart, lab work & pertinent test results  Airway Mallampati: II  TM Distance: >3 FB Neck ROM: Full    Dental no notable dental hx.    Pulmonary sleep apnea ,    Pulmonary exam normal breath sounds clear to auscultation       Cardiovascular negative cardio ROS Normal cardiovascular exam Rhythm:Regular Rate:Normal     Neuro/Psych negative neurological ROS  negative psych ROS   GI/Hepatic negative GI ROS, Neg liver ROS,   Endo/Other  negative endocrine ROS  Renal/GU negative Renal ROS     Musculoskeletal negative musculoskeletal ROS (+)   Abdominal   Peds  Hematology  (+) anemia ,   Anesthesia Other Findings Menorrhagia with regular cycle Endometrial Mass  Reproductive/Obstetrics                            Anesthesia Physical Anesthesia Plan  ASA: II  Anesthesia Plan: General   Post-op Pain Management:    Induction: Intravenous  PONV Risk Score and Plan: 3 and Midazolam, Dexamethasone, Ondansetron and Treatment may vary due to age or medical condition  Airway Management Planned: LMA  Additional Equipment:   Intra-op Plan:   Post-operative Plan: Extubation in OR  Informed Consent: I have reviewed the patients History and Physical, chart, labs and discussed the procedure including the risks, benefits and alternatives for the proposed anesthesia with the patient or authorized representative who has indicated his/her understanding and acceptance.   Dental advisory given  Plan Discussed with: CRNA  Anesthesia Plan Comments:         Anesthesia Quick Evaluation

## 2018-04-22 NOTE — Brief Op Note (Signed)
04/22/2018  1:44 PM  PATIENT:  Stephanie Marshall  37 y.o. female  PRE-OPERATIVE DIAGNOSIS:  N92.0 Menorrhagia with regular cycle N94.89 Endometrial Mass  POST-OPERATIVE DIAGNOSIS:  N92.0 Menorrhagia with regular cycle  PROCEDURE:  Procedure(s) with comments: [place] (N/A) - endometrial mass MYOMECTOMY, Hysteroscopy (N/A)  SURGEON:  Surgeon(s) and Role:    Thurnell Lose, MD - Primary  PHYSICIAN ASSISTANT:   ASSISTANTS: none   ANESTHESIA:   local and general  EBL:  10 mL   BLOOD ADMINISTERED:none  DRAINS: Urinary Catheter (Foley)   LOCAL MEDICATIONS USED:  LIDOCAINE , Amount: 10 ml and OTHER VASOPRESSIN 20/50  SPECIMEN:  Source of Specimen:  Uterine fibroid, endometrial currettings  DISPOSITION OF SPECIMEN:  PATHOLOGY  COUNTS:  YES  TOURNIQUET:  * No tourniquets in log *  DICTATION: .Other Dictation: Dictation Number [id number]  PLAN OF CARE: Discharge to home after PACU  PATIENT DISPOSITION:  PACU - hemodynamically stable.   Delay start of Pharmacological VTE agent (>24hrs) due to surgical blood loss or risk of bleeding: yes

## 2018-04-22 NOTE — Discharge Instructions (Addendum)
** You had 1000 MG of Tylenol IV at 2:49PM ** No Ibuprofen until 7:30pm if needed  Myomectomy, Care After Refer to this sheet in the next few weeks. These instructions provide you with information on caring for yourself after your procedure. Your health care provider may also give you more specific instructions. Your treatment has been planned according to current medical practices, but problems sometimes occur. Call your health care provider if you have any problems or questions after your procedure. What can I expect after the procedure? After your procedure, it is typical to have the following:  Pain in your abdomen, especially at any incision sites. You will be given pain medicine to control the pain.  Tiredness. This is a normal part of the recovery process. Your energy level will return to normal over the next several weeks.  Constipation.  Vaginal bleeding. This is normal and should stop after 1-2 weeks.  Follow these instructions at home:  Only take over-the-counter or prescription medicines as directed by your health care provider. Avoid aspirin because it can cause bleeding.  Do not douche, use tampons, or have sexual intercourse until given permission by your health care provider.  Remove or change any bandages (dressings) as directed by your health care provider.  Take showers instead of baths as directed by your health care provider.  You will probably be able to go back to your normal routine after a few days. Do not do anything that requires extra effort until your health care provider says it is okay. Do not lift anything heavier than 15 pounds (6.8 kg) until your health care provider approves.  Walk daily but take frequent rest breaks if you tire easily.  Continue to practice deep breathing and coughing. If it hurts to cough, try holding a pillow against your belly as you cough.  If you become constipated, you may: ? Use a mild laxative if your health care provider  approves. ? Add more fruit and bran to your diet. ? Drink enough fluids to keep your urine clear or pale yellow.  Take your temperature twice a day and write it down.  Do not drink alcohol.  Do not drive until your health care provider approves.  Have someone help you at home for 1 week or until you can do your own household activities.  Follow up with your health care provider as directed. Contact a health care provider if:  You have a fever.  You have increasing abdominal pain that is not relieved with medicine.  You have nausea, vomiting, or diarrhea.  You have pain when you urinate, or you have blood in your urine.  You have a rash on your body.  You have pain or redness where your IV access tube was inserted.  You have redness, swelling, or any kind of drainage from an incision. Get help right away if:  You have weakness or lightheadedness.  You have pain, swelling, or redness in your legs.  You have chest pain.  You faint.  You have shortness of breath.  You have heavy vaginal bleeding.  Your incision is opening up. This information is not intended to replace advice given to you by your health care provider. Make sure you discuss any questions you have with your health care provider. Document Released: 04/17/2011 Document Revised: 05/02/2016 Document Reviewed: 07/07/2013 Elsevier Interactive Patient Education  2017 [place] [facility] Care Instructions  Activity: Get plenty of rest for the remainder of the day.  A responsible individual must stay with you for 24 hours following the procedure.  For the next 24 hours, DO NOT: -Drive a car -Paediatric nurse -Drink alcoholic beverages -Take any medication unless instructed by your physician -Make any legal decisions or sign important papers.  Meals: Start with liquid foods such as gelatin or soup. Progress to regular foods as tolerated. Avoid greasy, spicy, heavy foods. If nausea  and/or vomiting occur, drink only clear liquids until the nausea and/or vomiting subsides. Call your physician if vomiting continues.  Special Instructions/Symptoms: Your throat may feel dry or sore from the anesthesia or the breathing tube placed in your throat during surgery. If this causes discomfort, gargle with warm salt water. The discomfort should disappear within 24 hours.  If you had a scopolamine patch placed behind your ear for the management of post- operative nausea and/or vomiting:  1. The medication in the patch is effective for 72 hours, after which it should be removed.  Wrap patch in a tissue and discard in the trash. Wash hands thoroughly with soap and water. 2. You may remove the patch earlier than 72 hours if you experience unpleasant side effects which may include dry mouth, dizziness or visual disturbances. 3. Avoid touching the patch. Wash your hands with soap and water after contact with the patch.

## 2018-04-22 NOTE — Anesthesia Procedure Notes (Signed)
Procedure Name: LMA Insertion Date/Time: 04/22/2018 12:47 PM Performed by: Gwyndolyn Saxon, CRNA Pre-anesthesia Checklist: Patient identified, Emergency Drugs available, Suction available, Patient being monitored and Timeout performed Patient Re-evaluated:Patient Re-evaluated prior to induction Oxygen Delivery Method: Circle system utilized Preoxygenation: Pre-oxygenation with 100% oxygen Induction Type: IV induction Ventilation: Mask ventilation without difficulty LMA: LMA inserted LMA Size: 4.0 Number of attempts: 1 Placement Confirmation: positive ETCO2,  CO2 detector and breath sounds checked- equal and bilateral Tube secured with: Tape Dental Injury: Teeth and Oropharynx as per pre-operative assessment

## 2018-04-22 NOTE — H&P (Signed)
   Reason for Appointment  1. Pre-op appt   History of Present Illness  General:  Pt presents for preop for hyst/D&C/Myosure to remove endometrial mass which is likely a fibroid. Pt without complaints. Menses are still heavy. Desires option to preserve fertility.   Current Medications  Discontinued   Integra 62.5-62.5-40-3 MG Capsule 1 capsule Orally Once a day   Medication List reviewed and reconciled with the patient    Past Medical History  Vitamin D deficiency.   Postpartum depression.   H/o anemia.   Family h/o Blood clots (father PE, PGM on Coumadin).           Surgical History  C sections 09/12/2014  uterine polyp removal 2012  repair LT ankle 2005   Family History  Father: alive 32 yrs, high blood pressure, high cholesterol, Degenerative Disc Dz, diagnosed with Hypertension  Mother: alive 66 yrs, high blood pressure, had Breast cancer, had brain tumor, Hypertension, Breast cancer  Paternal [place] Father: deceased  Paternal [place] Mother: alive, possible gyn cancer  Maternal Grand Father: deceased  Maternal Grand Mother: deceased  1 son(s) .   paternal aunt.   Social History  General:  Tobacco use  cigarettes: Never smoked Tobacco history last updated 04/01/2018 Alcohol: no.  Caffeine: None.  Recreational drug use: no.  DIET: no diet.  Exercise: minimal.  DENTAL CARE: See a dentist as needed.  Marital Status: Married.  Children: 1.  EDUCATION: [employer].  OCCUPATION: stay at home mom.    Gyn History  Sexual activity currently sexually active.  Periods : every month.  LMP 03/23/2018.  Birth control condoms.  Last pap smear date 11/03/2017 Neg/HPVneg.  Denies H/O Last mammogram date.  Denies H/O Abnormal pap smear.  Denies H/O STD.    OB History  Number of pregnancies 1.  Pregnancy # 1 live birth, C-section delivery.    Allergies  erythromycin: VOMITING AND PASSING OUT - Side Effects   Hospitalization/Major Diagnostic Procedure  repair LT  ankle 2005  polyp ectomy 2012  C sections 09/12/2014  None 02/2018   Review of Systems  Denies fever/chills, chest pain, SOB, headaches, numbness/tingling. No h/o complication with anesthesia, bleeding disorders or blood clots.   Vital Signs  Wt 160, Wt change -2 lb, Ht 65, BMI 26.62, Pulse sitting 96, BP sitting 109/80 109/80.   Physical Examination  GENERAL:  Patient appears alert and oriented.  General Appearance: well-appearing, well-developed, no acute distress.  Speech: clear.  LUNGS:  Auscultation: no wheezing/rhonchi/rales. CTA bilaterally.  HEART:  Heart sounds: normal. RRR. no murmur.  ABDOMEN:  General: soft nontender, nondistended, no masses.  FEMALE GENITOURINARY:  Pelvic Not examined.  EXTREMITIES:  General: No edema or calf tenderness.     Assessments   1. Pre-operative clearance - Z01.818 (Primary)   2. Menorrhagia with regular cycle - N92.0   3. Endometrial mass - N94.89   Visit Codes  99213 OV LEVEL 3.    Follow Up  2 Weeks post op

## 2018-04-22 NOTE — Interval H&P Note (Signed)
History and Physical Interval Note:  04/22/2018 12:16 PM  Stephanie Marshall  has presented today for surgery, with the diagnosis of N92.0 Menorrhagia with regular cycle N94.89 Endometrial Mass  The various methods of treatment have been discussed with the patient and family. After consideration of risks, benefits and other options for treatment, the patient has consented to  Procedure(s) with comments: [name] (N/A) - endometrial mass as a surgical intervention .  The patient's history has been reviewed, patient examined, no change in status, stable for surgery.  I have reviewed the patient's chart and labs.  Questions were answered to the patient's satisfaction.     Thurnell Lose

## 2018-04-23 ENCOUNTER — Encounter (HOSPITAL_BASED_OUTPATIENT_CLINIC_OR_DEPARTMENT_OTHER): Payer: Self-pay | Admitting: Obstetrics and Gynecology

## 2018-04-23 NOTE — Anesthesia Postprocedure Evaluation (Signed)
Anesthesia Post Note  Patient: Stephanie Marshall  Procedure(s) Performed: DILATATION & CURETTAGE/HYSTEROSCOPY WITH MYOSURE (N/A Uterus) MYOMECTOMY, Hysteroscopy (N/A Uterus)     Patient location during evaluation: PACU Anesthesia Type: General Level of consciousness: awake and alert Pain management: pain level controlled Vital Signs Assessment: post-procedure vital signs reviewed and stable Respiratory status: spontaneous breathing, nonlabored ventilation, respiratory function stable and patient connected to nasal cannula oxygen Cardiovascular status: blood pressure returned to baseline and stable Postop Assessment: no apparent nausea or vomiting Anesthetic complications: no    Last Vitals:  Vitals:   04/22/18 1515 04/22/18 1545  BP: 114/74 115/60  Pulse: 69 64  Resp: (!) 22 18  Temp:  36.6 C  SpO2: 100% 100%    Last Pain:  Vitals:   04/22/18 1515  TempSrc:   PainSc: 4                  Ryan P Ellender

## 2018-04-23 NOTE — Op Note (Signed)
NAME: Stephanie Marshall, Stephanie Marshall MEDICAL RECORD [id number] ACCOUNT [id number] DATE OF BIRTH:[birth year] FACILITY: [facility] LOCATION: [facility] PHYSICIAN:Jessel Gettinger Al Decant, MD  OPERATIVE REPORT  DATE OF PROCEDURE:  04/22/2018  PREOPERATIVE DIAGNOSES:  Menorrhagia with regular cycle, endometrial mass.  POSTOPERATIVE DIAGNOSES:  Menorrhagia with regular cycle, endometrial mass, fibroid.  PROCEDURE:  Hysteroscopy, D and C, with hysteroscopic myomectomy via MyoSure.  SURGEON:  Thurnell Lose, MD  ASSISTANT:  None.  ANESTHESIA:  Local and general.  ESTIMATED BLOOD LOSS:  10 mL.  DRAINS:  Foley catheter.  ANESTHETIC:  Local anesthetic provided with 2% lidocaine 10 mL and vasopressin 20 units and 50 mL of saline.  SPECIMENS:  Uterine fibroid and endometrial curettings.  DISPOSITION OF SPECIMENS:  To pathology.  PATIENT DISPOSITION:  To PACU hemodynamically stable.  COMPLICATIONS:  None.  FINDINGS:  Retroverted uterus, normal-appearing endometrium anterior to right lateral sidewall fibroid, not extruding into the cavity significantly.  Normal ostia, normal cervix.  DESCRIPTION OF PROCEDURE:  The patient was identified in the holding area.  She was then taken to the operating room where she was placed in dorsal lithotomy position.  She underwent general anesthesia without complication.  She was then prepped and  draped in normal sterile fashion.  SCDs were on her legs and operating prior to the start of the procedure.  A timeout was performed.  A Graves speculum was inserted into the vagina, and the cervix was identified.  The anterior lip of the cervix was injected with 2% lidocaine, and a single-tooth tenaculum was then applied.  Os finder was then used to dilate the os, and dilators were used were used to dilate the internal os.  It felt like a fibroid, but it was just tight via hysteroscopic inspection.  I dilated the os further and was able to advance the hysteroscope by sharply angling  the hysteroscope posteriorly.  The findings above were noted.  Prior to placing the hysteroscope, I did inject vasopressin due to suspected bleeding with the myomectomy.  The MyoSure Reach was then used to shave down the endometrium and the fibroids.  I did a few maneuvers, and the fibroid then popped out, and I was able to resect the fibroid in its entirety.  I used the MyoSure mostly on the anterior portion of the  uterus and the right lateral sidewall.  There was no perforation.  EBL deficit was 1600.  All instruments were removed from the uterus.  I did a curettage of all 4 quadrants of the uterus, and specimen was collected.  A single-tooth tenaculum was removed.  Tenaculum site was made hemostatic with silver nitrate.  All instruments were then  removed from the vagina.  The patient tolerated the procedure well.  Instrument, sponge and needle counts were correct x3.  There was some bleeding at the end.  I originally wanted to use Methergine, but the bleeding stopped rather quickly.  The patient tolerated the procedure  well, and she was taken to the recovery room in stable condition.  LN/NUANCE  D:04/22/2018 T:04/22/2018 JOB:[id number]

## 2018-05-25 DIAGNOSIS — F4323 Adjustment disorder with mixed anxiety and depressed mood: Secondary | ICD-10-CM | POA: Diagnosis not present

## 2018-07-30 DIAGNOSIS — F4322 Adjustment disorder with anxiety: Secondary | ICD-10-CM | POA: Diagnosis not present

## 2018-08-06 DIAGNOSIS — N946 Dysmenorrhea, unspecified: Secondary | ICD-10-CM | POA: Diagnosis not present

## 2018-08-06 DIAGNOSIS — N924 Excessive bleeding in the premenopausal period: Secondary | ICD-10-CM | POA: Diagnosis not present

## 2018-08-06 DIAGNOSIS — N9489 Other specified conditions associated with female genital organs and menstrual cycle: Secondary | ICD-10-CM | POA: Diagnosis not present

## 2018-10-12 DIAGNOSIS — F330 Major depressive disorder, recurrent, mild: Secondary | ICD-10-CM | POA: Diagnosis not present

## 2018-10-28 DIAGNOSIS — F4323 Adjustment disorder with mixed anxiety and depressed mood: Secondary | ICD-10-CM | POA: Diagnosis not present

## 2018-11-16 DIAGNOSIS — Z01419 Encounter for gynecological examination (general) (routine) without abnormal findings: Secondary | ICD-10-CM | POA: Diagnosis not present
